# Patient Record
Sex: Female | Born: 1990 | Race: Black or African American | Hispanic: No | Marital: Single | Smoking: Never smoker
Health system: Southern US, Community
[De-identification: ages and names within clinical notes are randomized; demographics above are authoritative.]

## PROBLEM LIST (undated history)

## (undated) DIAGNOSIS — K219 Gastro-esophageal reflux disease without esophagitis: Secondary | ICD-10-CM

## (undated) DIAGNOSIS — E282 Polycystic ovarian syndrome: Secondary | ICD-10-CM

## (undated) DIAGNOSIS — R519 Headache, unspecified: Secondary | ICD-10-CM

## (undated) HISTORY — DX: Headache, unspecified: R51.9

## (undated) HISTORY — DX: Polycystic ovarian syndrome: E28.2

## (undated) HISTORY — DX: Gastro-esophageal reflux disease without esophagitis: K21.9

## (undated) HISTORY — PX: CHOLECYSTECTOMY: SHX55

---

## 2013-08-17 DIAGNOSIS — K529 Noninfective gastroenteritis and colitis, unspecified: Secondary | ICD-10-CM | POA: Insufficient documentation

## 2014-04-05 DIAGNOSIS — S93401A Sprain of unspecified ligament of right ankle, initial encounter: Secondary | ICD-10-CM | POA: Insufficient documentation

## 2019-10-09 DIAGNOSIS — K602 Anal fissure, unspecified: Secondary | ICD-10-CM | POA: Insufficient documentation

## 2019-10-09 DIAGNOSIS — K5900 Constipation, unspecified: Secondary | ICD-10-CM | POA: Insufficient documentation

## 2019-10-09 DIAGNOSIS — K625 Hemorrhage of anus and rectum: Secondary | ICD-10-CM | POA: Insufficient documentation

## 2020-10-23 ENCOUNTER — Other Ambulatory Visit: Payer: Self-pay | Admitting: Gastroenterology

## 2020-10-23 DIAGNOSIS — R1310 Dysphagia, unspecified: Secondary | ICD-10-CM

## 2020-10-29 ENCOUNTER — Ambulatory Visit
Admission: RE | Admit: 2020-10-29 | Discharge: 2020-10-29 | Disposition: A | Discharge status: Home / Self Care | Source: Ambulatory Visit | Attending: Gastroenterology | Admitting: Gastroenterology

## 2020-10-29 DIAGNOSIS — R1310 Dysphagia, unspecified: Secondary | ICD-10-CM

## 2020-12-15 DIAGNOSIS — R1030 Lower abdominal pain, unspecified: Secondary | ICD-10-CM | POA: Insufficient documentation

## 2020-12-15 DIAGNOSIS — N926 Irregular menstruation, unspecified: Secondary | ICD-10-CM | POA: Insufficient documentation

## 2021-02-13 HISTORY — PX: UTERINE FIBROID SURGERY: SHX826

## 2021-04-23 ENCOUNTER — Emergency Department (HOSPITAL_BASED_OUTPATIENT_CLINIC_OR_DEPARTMENT_OTHER)
Admission: EM | Admit: 2021-04-23 | Discharge: 2021-04-23 | Disposition: A | Discharge status: Home / Self Care | Attending: Emergency Medicine | Admitting: Emergency Medicine

## 2021-04-23 ENCOUNTER — Encounter (HOSPITAL_BASED_OUTPATIENT_CLINIC_OR_DEPARTMENT_OTHER): Payer: Self-pay | Admitting: Emergency Medicine

## 2021-04-23 ENCOUNTER — Other Ambulatory Visit: Payer: Self-pay

## 2021-04-23 DIAGNOSIS — Z91040 Latex allergy status: Secondary | ICD-10-CM | POA: Diagnosis not present

## 2021-04-23 DIAGNOSIS — R5383 Other fatigue: Secondary | ICD-10-CM | POA: Diagnosis not present

## 2021-04-23 DIAGNOSIS — M545 Low back pain, unspecified: Secondary | ICD-10-CM | POA: Diagnosis not present

## 2021-04-23 DIAGNOSIS — N939 Abnormal uterine and vaginal bleeding, unspecified: Secondary | ICD-10-CM | POA: Diagnosis present

## 2021-04-23 DIAGNOSIS — R110 Nausea: Secondary | ICD-10-CM | POA: Diagnosis not present

## 2021-04-23 DIAGNOSIS — N83202 Unspecified ovarian cyst, left side: Secondary | ICD-10-CM | POA: Insufficient documentation

## 2021-04-23 LAB — WET PREP, GENITAL
Clue Cells Wet Prep HPF POC: NONE SEEN
Sperm: NONE SEEN
Trich, Wet Prep: NONE SEEN
WBC, Wet Prep HPF POC: NONE SEEN
Yeast Wet Prep HPF POC: NONE SEEN

## 2021-04-23 LAB — CBC WITH DIFFERENTIAL/PLATELET
Abs Immature Granulocytes: 0.03 10*3/uL (ref 0.00–0.07)
Basophils Absolute: 0 10*3/uL (ref 0.0–0.1)
Basophils Relative: 1 %
Eosinophils Absolute: 0.2 10*3/uL (ref 0.0–0.5)
Eosinophils Relative: 4 %
HCT: 31.7 % — ABNORMAL LOW (ref 36.0–46.0)
Hemoglobin: 10.8 g/dL — ABNORMAL LOW (ref 12.0–15.0)
Immature Granulocytes: 1 %
Lymphocytes Relative: 37 %
Lymphs Abs: 2 10*3/uL (ref 0.7–4.0)
MCH: 28.7 pg (ref 26.0–34.0)
MCHC: 34.1 g/dL (ref 30.0–36.0)
MCV: 84.3 fL (ref 80.0–100.0)
Monocytes Absolute: 0.4 10*3/uL (ref 0.1–1.0)
Monocytes Relative: 8 %
Neutro Abs: 2.7 10*3/uL (ref 1.7–7.7)
Neutrophils Relative %: 49 %
Platelets: 237 10*3/uL (ref 150–400)
RBC: 3.76 MIL/uL — ABNORMAL LOW (ref 3.87–5.11)
RDW: 13.4 % (ref 11.5–15.5)
WBC: 5.5 10*3/uL (ref 4.0–10.5)
nRBC: 0 % (ref 0.0–0.2)

## 2021-04-23 LAB — URINALYSIS, MICROSCOPIC (REFLEX): RBC / HPF: >50 RBC/hpf (ref 0–5)

## 2021-04-23 LAB — URINALYSIS, ROUTINE W REFLEX MICROSCOPIC
Bilirubin Urine: NEGATIVE
Glucose, UA: NEGATIVE mg/dL
Hgb urine dipstick: NEGATIVE
Ketones, ur: NEGATIVE mg/dL
Leukocytes,Ua: NEGATIVE
Nitrite: NEGATIVE
Protein, ur: 30 mg/dL — AB
Specific Gravity, Urine: 1.03 (ref 1.005–1.030)
pH: 6 (ref 5.0–8.0)

## 2021-04-23 LAB — COMPREHENSIVE METABOLIC PANEL
ALT: 26 U/L (ref 0–44)
AST: 18 U/L (ref 15–41)
Albumin: 3.6 g/dL (ref 3.5–5.0)
Alkaline Phosphatase: 59 U/L (ref 38–126)
Anion gap: 5 (ref 5–15)
BUN: 10 mg/dL (ref 6–20)
CO2: 23 mmol/L (ref 22–32)
Calcium: 8.8 mg/dL — ABNORMAL LOW (ref 8.9–10.3)
Chloride: 108 mmol/L (ref 98–111)
Creatinine, Ser: 0.67 mg/dL (ref 0.44–1.00)
GFR, Estimated: >60 mL/min (ref >60)
Glucose, Bld: 129 mg/dL — ABNORMAL HIGH (ref 70–99)
Potassium: 3.7 mmol/L (ref 3.5–5.1)
Sodium: 136 mmol/L (ref 135–145)
Total Bilirubin: 0.1 mg/dL — ABNORMAL LOW (ref 0.3–1.2)
Total Protein: 6.8 g/dL (ref 6.5–8.1)

## 2021-04-23 LAB — LIPASE, BLOOD: Lipase: 30 U/L (ref 11–51)

## 2021-04-23 LAB — PREGNANCY, URINE: Preg Test, Ur: NEGATIVE

## 2021-04-23 MED ORDER — SODIUM CHLORIDE 0.9 % IV SOLN: Freq: Once | INTRAVENOUS | Status: AC

## 2021-04-23 MED ORDER — ONDANSETRON HCL 4 MG/2ML IJ SOLN
4.0000 mg | Freq: Once | INTRAMUSCULAR | Status: AC
  Administered 2021-04-23: 4 mg via INTRAVENOUS
  Filled 2021-04-23: qty 2

## 2021-04-23 MED ORDER — FERROUS SULFATE 325 (65 FE) MG PO TABS: 325.0000 mg | ORAL_TABLET | Freq: Every day | ORAL | 0 refills | Status: DC

## 2021-04-23 MED ORDER — ACETAMINOPHEN 500 MG PO TABS
1000.0000 mg | ORAL_TABLET | Freq: Once | ORAL | Status: AC
  Administered 2021-04-23: 1000 mg via ORAL
  Filled 2021-04-23: qty 2

## 2021-04-23 MED ORDER — FENTANYL CITRATE PF 50 MCG/ML IJ SOSY
50.0000 ug | PREFILLED_SYRINGE | INTRAMUSCULAR | Status: DC | PRN
  Administered 2021-04-23: 50 ug via INTRAVENOUS
  Filled 2021-04-23: qty 1

## 2021-04-23 MED ORDER — ONDANSETRON 4 MG PO TBDP: 4.0000 mg | ORAL_TABLET | Freq: Three times a day (TID) | ORAL | 0 refills | Status: AC | PRN

## 2021-04-23 MED ORDER — IBUPROFEN 800 MG PO TABS: 800.0000 mg | ORAL_TABLET | Freq: Three times a day (TID) | ORAL | 0 refills | Status: AC

## 2021-04-23 NOTE — ED Triage Notes (Signed)
Vaginal bleeding x 1 month , 1 pad /hourly. Passing clots. Negative preg test last month. Lower abd pain

## 2021-04-23 NOTE — Medical Student Note (Signed)
[facility] Provider Student Note For educational purposes for Medical, PA and NP students only and not part of the legal medical record.   CSN: [id number] Arrival date & time: 04/23/21  1027      History   Chief Complaint Chief Complaint  Patient presents with   Vaginal Bleeding    HPI Crystal Chase is a 30 y.o. female who presents today for evaluation of vaginal bleeding. She reports the vaginal bleeding started 8/31 which she believed to be her period. However, after 1.5 weeks, the bleeding continued to worsen with passage of clots. Her PCP started her on a double dose of estrogen OCP which initially helped slow the bleeding for a few days.She states she continues to have heavy bleeding and changes her pad 1x/hr. It is associated with lower abdominal pain that she describes as a constant pain with intermittent sharp stabbing pain. She also endorses lower back pain, lightheadedness, generalized fatigue, and nausea. LMP was August 2021. Menstruation typically lasts for 5 days with 1-2 days of heavy bleeding. Hx of abnormal vaginal bleeding in May 2022 and was found to have a polyp. Polyp removed July 2022. Reports family hx of fibroids, breast cancer, and ovarian cysts. Denies possibility of pregnancy. Denies SOB, chest pain, or dizziness. Has OBGYN appt next week on 9/29.   Vaginal Bleeding  History reviewed. No pertinent past medical history.  There are no problems to display for this patient.   History reviewed. No pertinent surgical history.  OB History   No obstetric history on file.      Home Medications    Prior to Admission medications   Not on File    Family History No family history on file.  Social History Social History   Substance Use Topics   Alcohol use: Yes   Drug use: Not Currently     Allergies   Fexofenadine, Latex, and Whey   Review of Systems Review of Systems  Genitourinary:  Positive for vaginal bleeding.     Physical Exam Updated Vital Signs BP 117/87 (BP Location: Right Arm)   Pulse (!) 105   Temp 98.5 F (36.9 C) (Oral)   Ht 5\' 3"  (1.6 m)   Wt 86.2 kg   LMP 03/23/2021 (Approximate)   SpO2 99%   BMI 33.66 kg/m   Physical Exam   ED Treatments / Results  Labs (all labs ordered are listed, but only abnormal results are displayed) Labs Reviewed  WET PREP, GENITAL  PREGNANCY, URINE  CBC WITH DIFFERENTIAL/PLATELET  COMPREHENSIVE METABOLIC PANEL  LIPASE, BLOOD  URINALYSIS, ROUTINE W REFLEX MICROSCOPIC  GC/CHLAMYDIA PROBE AMP ([place]) NOT AT [facility]    EKG  Radiology No results found.  Procedures Procedures (including critical care time)  Medications Ordered in ED Medications  0.9 %  sodium chloride infusion (has no administration in time range)  fentaNYL (SUBLIMAZE) injection 50 mcg (has no administration in time range)  acetaminophen (TYLENOL) tablet 1,000 mg (has no administration in time range)     Initial Impression / Assessment and Plan / ED Course  I have reviewed the triage vital signs and the nursing notes.  Pertinent labs & imaging results that were available during my care of the patient were reviewed by me and considered in my medical decision making (see chart for details).    Will obtain basic labs to evaluate for anemia given few weeks of heavy vaginal bleeding with associated fatigue and lightheadedness. Provided symptomatic pain relief while in ED and  encouraged OTC medications for pain relief until OB appt next week. Given hx of polyps, will need further work-up for possibility of polyps, fibroids, ovarian cysts, and other gynecological issues which can be completed outpatient.  Final Clinical Impressions(s) / ED Diagnoses   Final diagnoses:  None    New Prescriptions New Prescriptions   No medications on file

## 2021-04-23 NOTE — ED Provider Notes (Addendum)
[facility] EMERGENCY DEPARTMENT Provider Note   CSN: [id number] Arrival date & time: 04/23/21  2924     History Chief Complaint  Patient presents with   Vaginal Bleeding    Crystal Chase is a 30 y.o. female.  HPI Crystal Chase is a 30 y.o. female who presents today for evaluation of vaginal bleeding. She reports the vaginal bleeding started 8/31 which she believed to be her period. However, after 1.5 weeks, the bleeding continued to worsen with passage of clots. Her PCP started her on a double dose of estrogen OCP which initially helped slow the bleeding for a few days but she feels that her bleeding has continued.  She states she continues to have heavy bleeding and changes her pad 1x/hr. It is associated with lower abdominal pain that she describes as a constant pain with intermittent sharp stabbing pain. She also endorses lower back pain, lightheadedness, generalized fatigue, and nausea. LMP was August 2021. Menstruation typically lasts for 5 days with 1-2 days of heavy bleeding. Hx of abnormal vaginal bleeding in May 2022 and was found to have a polyp. Polyp removed July 2022. Reports family hx of fibroids, breast cancer, and ovarian cysts. Denies possibility of pregnancy. Denies SOB, chest pain, or dizziness. Has OBGYN appt next week on 9/29.  Has not been taking any ibuprofen although she was recommended 800 mg every 6 hours by OB/GYN.  She denies any chest pain or shortness of breath    History reviewed. No pertinent past medical history.  There are no problems to display for this patient.   History reviewed. No pertinent surgical history.   OB History   No obstetric history on file.     No family history on file.  Social History   Substance Use Topics   Alcohol use: Yes   Drug use: Not Currently    Home Medications Prior to Admission medications   Medication Sig Start Date End Date Taking? Authorizing Provider  ferrous sulfate 325 (65 FE) MG  tablet Take 1 tablet (325 mg total) by mouth daily. 04/23/21  Yes Claron Rosencrans S, PA  ibuprofen (ADVIL) 800 MG tablet Take 1 tablet (800 mg total) by mouth 3 (three) times daily. 04/23/21  Yes Damiyah Ditmars S, PA  ondansetron (ZOFRAN ODT) 4 MG disintegrating tablet Take 1 tablet (4 mg total) by mouth every 8 (eight) hours as needed for nausea or vomiting. 04/23/21  Yes Fermin Yan S, PA    Allergies    Fexofenadine, Latex, and Whey  Review of Systems   Review of Systems  Constitutional:  Negative for chills and fever.  HENT:  Negative for congestion.   Eyes:  Negative for pain.  Respiratory:  Negative for cough and shortness of breath.   Cardiovascular:  Negative for chest pain and leg swelling.  Gastrointestinal:  Positive for abdominal pain. Negative for diarrhea, nausea and vomiting.  Genitourinary:  Positive for vaginal bleeding. Negative for dysuria.  Musculoskeletal:  Negative for myalgias.  Skin:  Negative for rash.  Neurological:  Negative for dizziness and headaches.   Physical Exam Updated Vital Signs BP 107/69   Pulse 75   Temp 98.5 F (36.9 C) (Oral)   Resp 16   Ht 5\' 3"  (1.6 m)   Wt 86.2 kg   LMP 03/23/2021 (Approximate)   SpO2 97%   BMI 33.66 kg/m   Physical Exam Vitals and nursing note reviewed. Exam conducted with a chaperone present (PA student 03/25/2021).  Constitutional:  General: She is not in acute distress. HENT:     Head: Normocephalic and atraumatic.     Nose: Nose normal.     Mouth/Throat:     Mouth: Mucous membranes are moist.  Eyes:     General: No scleral icterus. Cardiovascular:     Rate and Rhythm: Normal rate and regular rhythm.     Pulses: Normal pulses.     Heart sounds: Normal heart sounds.  Pulmonary:     Effort: Pulmonary effort is normal. No respiratory distress.     Breath sounds: Normal breath sounds. No wheezing.  Abdominal:     Palpations: Abdomen is soft.     Tenderness: There is no abdominal tenderness.  There is no right CVA tenderness, left CVA tenderness, guarding or rebound.  Genitourinary:    Comments: Vulva without lesions or abnormality Vaginal canal without abnormal discharge or lesion --there is bleeding present on exam w clots. No BRB Cervix appears normal, is closed No adnexal tenderness or CMT   Musculoskeletal:     Cervical back: Normal range of motion.     Right lower leg: No edema.     Left lower leg: No edema.  Skin:    General: Skin is warm and dry.     Capillary Refill: Capillary refill takes less than 2 seconds.  Neurological:     Mental Status: She is alert. Mental status is at baseline.  Psychiatric:        Mood and Affect: Mood normal.        Behavior: Behavior normal.    ED Results / Procedures / Treatments   Labs (all labs ordered are listed, but only abnormal results are displayed) Labs Reviewed  CBC WITH DIFFERENTIAL/PLATELET - Abnormal; Notable for the following components:      Result Value   RBC 3.76 (*)    Hemoglobin 10.8 (*)    HCT 31.7 (*)    All other components within normal limits  COMPREHENSIVE METABOLIC PANEL - Abnormal; Notable for the following components:   Glucose, Bld 129 (*)    Calcium 8.8 (*)    Total Bilirubin 0.1 (*)    All other components within normal limits  URINALYSIS, ROUTINE W REFLEX MICROSCOPIC - Abnormal; Notable for the following components:   APPearance HAZY (*)    Protein, ur 30 (*)    All other components within normal limits  URINALYSIS, MICROSCOPIC (REFLEX) - Abnormal; Notable for the following components:   Bacteria, UA FEW (*)    All other components within normal limits  WET PREP, GENITAL  PREGNANCY, URINE  LIPASE, BLOOD  GC/CHLAMYDIA PROBE AMP ([place]) NOT AT [facility]    EKG None  Radiology No results found.  Procedures Procedures   Medications Ordered in ED Medications  0.9 %  sodium chloride infusion (0 mLs Intravenous Stopped 04/23/21 1308)  acetaminophen (TYLENOL) tablet 1,000 mg (1,000  mg Oral Given 04/23/21 1056)  ondansetron (ZOFRAN) injection 4 mg (4 mg Intravenous Given 04/23/21 1118)    ED Course  I have reviewed the triage vital signs and the nursing notes.  Pertinent labs & imaging results that were available during my care of the patient were reviewed by me and considered in my medical decision making (see chart for details).    MDM Rules/Calculators/A&P                           Patient is a 30 year old female with past medical history detailed  in HPI.  Patient is well-appearing on my exam has no abdominal tenderness and fall exam is benign although she does have some bleeding.  Patient with hemoglobin 2 weeks ago of 12.2 She also had an ultrasound done 2 weeks ago which showed hereogenous endometrium likely due to bleeding.   IMPRESSION:  1. 4.1 cm left ovarian cyst. No follow up imaging recommended. Note:  This recommendation does not apply to premenarchal patients or to  those with increased risk (genetic, family history, elevated tumor  markers or other high-risk factors) of ovarian cancer. Reference:  Radiology 2019 Nov; 293(2):359-371.  2. Mildly heterogeneous endometrium may be related to patient's  active bleeding. If there is high clinical concern for focal  endometrial lesion, consider follow-up sonohysterogram.  3. Small amount of free fluid in the pelvis.    Patient's hemoglobin today has dropped 1.4 g every 2 weeks.  She does feel that her bleeding has improved some today.  She is following up of her OB/GYN in 1 week.  CMP without abnormality lipase within norm limits urinalysis inconsistent with infection.  Urine pregnancy negative. Wet prep without yeast trichomonas or clue cells GC chlamydia pending  Return precautions were given.  Discussed case briefly with Dr. Renaye Rakers attending  physician.  Ultimately patient understands strict return precautions.  Will take ibuprofen regularly which she has not been taking.  Crystal Chase was  evaluated in Emergency Department on 04/23/2021 for the symptoms described in the history of present illness. She was evaluated in the context of the global COVID-19 pandemic, which necessitated consideration that the patient might be at risk for infection with the SARS-CoV-2 virus that causes COVID-19. Institutional protocols and algorithms that pertain to the evaluation of patients at risk for COVID-19 are in a state of rapid change based on information released by regulatory bodies including the CDC and federal and state organizations. These policies and algorithms were followed during the patient's care in the ED.   Final Clinical Impression(s) / ED Diagnoses Final diagnoses:  Vaginal bleeding    Rx / DC Orders ED Discharge Orders          Ordered    ferrous sulfate 325 (65 FE) MG tablet  Daily        04/23/21 1238    ibuprofen (ADVIL) 800 MG tablet  3 times daily        04/23/21 1238    ondansetron (ZOFRAN ODT) 4 MG disintegrating tablet  Every 8 hours PRN        04/23/21 1245             Gailen Shelter, [place] 04/23/21 1850    Gailen Shelter, [place] 04/23/21 1854    Terald Sleeper, MD 04/24/21 1047

## 2021-04-23 NOTE — Discharge Instructions (Addendum)
Please follow-up with your OB/GYN. Please take ibuprofen 800 mg every 6 hours.  This will help with pain and will help with bleeding.  Please drink plenty of water.  Return to the ER for any new or concerning symptoms. 0  Please keep your appointment with your OB/GYN.  You may always return to the ER for any new or concerning symptoms.

## 2021-04-24 LAB — GC/CHLAMYDIA PROBE AMP (~~LOC~~) NOT AT ARMC
Chlamydia: NEGATIVE
Comment: NEGATIVE
Comment: NORMAL
Neisseria Gonorrhea: NEGATIVE

## 2021-04-30 DIAGNOSIS — E282 Polycystic ovarian syndrome: Secondary | ICD-10-CM | POA: Insufficient documentation

## 2021-06-10 DIAGNOSIS — D500 Iron deficiency anemia secondary to blood loss (chronic): Secondary | ICD-10-CM | POA: Insufficient documentation

## 2021-10-30 ENCOUNTER — Other Ambulatory Visit: Payer: Self-pay | Admitting: Gastroenterology

## 2021-10-30 DIAGNOSIS — R1033 Periumbilical pain: Secondary | ICD-10-CM

## 2021-10-30 DIAGNOSIS — R110 Nausea: Secondary | ICD-10-CM

## 2021-11-12 ENCOUNTER — Ambulatory Visit
Admission: RE | Admit: 2021-11-12 | Discharge: 2021-11-12 | Disposition: A | Discharge status: Home / Self Care | Source: Ambulatory Visit | Attending: Gastroenterology | Admitting: Gastroenterology

## 2021-11-12 ENCOUNTER — Other Ambulatory Visit

## 2021-11-12 DIAGNOSIS — R110 Nausea: Secondary | ICD-10-CM

## 2021-11-12 DIAGNOSIS — R1033 Periumbilical pain: Secondary | ICD-10-CM

## 2021-11-12 MED ORDER — IOPAMIDOL (ISOVUE-300) INJECTION 61%
100.0000 mL | Freq: Once | INTRAVENOUS | Status: AC | PRN
  Administered 2021-11-12: 100 mL via INTRAVENOUS

## 2022-01-13 ENCOUNTER — Other Ambulatory Visit: Payer: Self-pay

## 2022-01-13 ENCOUNTER — Emergency Department (HOSPITAL_BASED_OUTPATIENT_CLINIC_OR_DEPARTMENT_OTHER)
Admission: EM | Admit: 2022-01-13 | Discharge: 2022-01-13 | Disposition: A | Discharge status: Home / Self Care | Attending: Emergency Medicine | Admitting: Emergency Medicine

## 2022-01-13 ENCOUNTER — Encounter (HOSPITAL_BASED_OUTPATIENT_CLINIC_OR_DEPARTMENT_OTHER): Payer: Self-pay

## 2022-01-13 DIAGNOSIS — Z91040 Latex allergy status: Secondary | ICD-10-CM | POA: Insufficient documentation

## 2022-01-13 DIAGNOSIS — G43809 Other migraine, not intractable, without status migrainosus: Secondary | ICD-10-CM | POA: Diagnosis not present

## 2022-01-13 DIAGNOSIS — G43909 Migraine, unspecified, not intractable, without status migrainosus: Secondary | ICD-10-CM | POA: Diagnosis present

## 2022-01-13 LAB — CBC WITH DIFFERENTIAL/PLATELET
Abs Immature Granulocytes: 0.04 10*3/uL (ref 0.00–0.07)
Basophils Absolute: 0 10*3/uL (ref 0.0–0.1)
Basophils Relative: 0 %
Eosinophils Absolute: 0.1 10*3/uL (ref 0.0–0.5)
Eosinophils Relative: 1 %
HCT: 41.6 % (ref 36.0–46.0)
Hemoglobin: 13.7 g/dL (ref 12.0–15.0)
Immature Granulocytes: 1 %
Lymphocytes Relative: 23 %
Lymphs Abs: 1.5 10*3/uL (ref 0.7–4.0)
MCH: 28.2 pg (ref 26.0–34.0)
MCHC: 32.9 g/dL (ref 30.0–36.0)
MCV: 85.6 fL (ref 80.0–100.0)
Monocytes Absolute: 0.3 10*3/uL (ref 0.1–1.0)
Monocytes Relative: 4 %
Neutro Abs: 4.7 10*3/uL (ref 1.7–7.7)
Neutrophils Relative %: 71 %
Platelets: 230 10*3/uL (ref 150–400)
RBC: 4.86 MIL/uL (ref 3.87–5.11)
RDW: 13.8 % (ref 11.5–15.5)
WBC: 6.7 10*3/uL (ref 4.0–10.5)
nRBC: 0 % (ref 0.0–0.2)

## 2022-01-13 LAB — BASIC METABOLIC PANEL
Anion gap: 12 (ref 5–15)
BUN: 9 mg/dL (ref 6–20)
CO2: 23 mmol/L (ref 22–32)
Calcium: 10 mg/dL (ref 8.9–10.3)
Chloride: 102 mmol/L (ref 98–111)
Creatinine, Ser: 0.66 mg/dL (ref 0.44–1.00)
GFR, Estimated: >60 mL/min (ref >60)
Glucose, Bld: 98 mg/dL (ref 70–99)
Potassium: 4.3 mmol/L (ref 3.5–5.1)
Sodium: 137 mmol/L (ref 135–145)

## 2022-01-13 MED ORDER — SODIUM CHLORIDE 0.9 % IV BOLUS
1000.0000 mL | Freq: Once | INTRAVENOUS | Status: AC
  Administered 2022-01-13: 1000 mL via INTRAVENOUS

## 2022-01-13 MED ORDER — DIPHENHYDRAMINE HCL 50 MG/ML IJ SOLN
12.5000 mg | Freq: Once | INTRAMUSCULAR | Status: AC
  Administered 2022-01-13: 12.5 mg via INTRAVENOUS
  Filled 2022-01-13: qty 1

## 2022-01-13 MED ORDER — METOCLOPRAMIDE HCL 5 MG/ML IJ SOLN
10.0000 mg | Freq: Once | INTRAMUSCULAR | Status: AC
  Administered 2022-01-13: 10 mg via INTRAVENOUS
  Filled 2022-01-13: qty 2

## 2022-01-13 MED ORDER — KETOROLAC TROMETHAMINE 30 MG/ML IJ SOLN
30.0000 mg | Freq: Once | INTRAMUSCULAR | Status: AC
  Administered 2022-01-13: 30 mg via INTRAVENOUS
  Filled 2022-01-13: qty 1

## 2022-01-13 NOTE — ED Provider Notes (Signed)
[facility] EMERGENCY DEPT Provider Note   CSN: [id number] Arrival date & time: 01/13/22  1051     History  Chief Complaint  Patient presents with   Migraine    Crystal Chase is a 30 y.o. female.  Patient reports that she has a history of migraine headaches.  Patient has seen her primary care physician and an ophthalmologist.  Patient reports she was treated at urgent care yesterday she is currently on Maxalt and prednisone.  Patient denies any cough URI or sinus symptoms she has not had any fever or chills.  Patient denies any numbness or tingling in extremities.  She does not have any weakness.  The history is provided by the patient. No language interpreter was used.  Migraine This is a new problem. The problem occurs constantly. The problem has not changed since onset.Pertinent negatives include no chest pain and no abdominal pain. Nothing aggravates the symptoms. Nothing relieves the symptoms. She has tried nothing for the symptoms.       Home Medications Prior to Admission medications   Medication Sig Start Date End Date Taking? Authorizing Provider  ferrous sulfate 325 (65 FE) MG tablet Take 1 tablet (325 mg total) by mouth daily. 04/23/21   Gailen Shelter, PA  ibuprofen (ADVIL) 800 MG tablet Take 1 tablet (800 mg total) by mouth 3 (three) times daily. 04/23/21   Gailen Shelter, PA  ondansetron (ZOFRAN ODT) 4 MG disintegrating tablet Take 1 tablet (4 mg total) by mouth every 8 (eight) hours as needed for nausea or vomiting. 04/23/21   Gailen Shelter, PA      Allergies    Fexofenadine, Latex, and Whey    Review of Systems   Review of Systems  Cardiovascular:  Negative for chest pain.  Gastrointestinal:  Negative for abdominal pain.  All other systems reviewed and are negative.   Physical Exam Updated Vital Signs BP 108/69   Pulse 79   Temp 98 F (36.7 C)   Resp 18   Ht 5\' 3"  (1.6 m)   Wt 92.5 kg   LMP 10/12/2021   SpO2 98%   BMI 36.14  kg/m  Physical Exam Vitals and nursing note reviewed.  Constitutional:      Appearance: She is well-developed.  HENT:     Head: Normocephalic and atraumatic.     Right Ear: Tympanic membrane normal.     Left Ear: Tympanic membrane normal.     Nose: Nose normal.     Mouth/Throat:     Mouth: Mucous membranes are moist.  Cardiovascular:     Rate and Rhythm: Normal rate.  Pulmonary:     Effort: Pulmonary effort is normal.  Abdominal:     General: Abdomen is flat. There is no distension.  Musculoskeletal:        General: Normal range of motion.     Cervical back: Normal range of motion.  Skin:    General: Skin is warm.  Neurological:     General: No focal deficit present.     Mental Status: She is alert and oriented to person, place, and time.     ED Results / Procedures / Treatments   Labs (all labs ordered are listed, but only abnormal results are displayed) Labs Reviewed  CBC WITH DIFFERENTIAL/PLATELET  BASIC METABOLIC PANEL    EKG None  Radiology No results found.  Procedures Procedures    Medications Ordered in ED Medications  sodium chloride 0.9 % bolus 1,000 mL (1,000 mLs Intravenous  New Bag/Given 01/13/22 1509)  ketorolac (TORADOL) 30 MG/ML injection 30 mg (30 mg Intravenous Given 01/13/22 1512)  metoCLOPramide (REGLAN) injection 10 mg (10 mg Intravenous Given 01/13/22 1511)  diphenhydrAMINE (BENADRYL) injection 12.5 mg (12.5 mg Intravenous Given 01/13/22 1510)    ED Course/ Medical Decision Making/ A&P                           Medical Decision Making Patient complains of a migraine headache  Amount and/or Complexity of Data Reviewed External Data Reviewed: notes.    Details: Primary care notes and urgent care notes reviewed Labs: ordered. Decision-making details documented in ED Course.    Details: Labs ordered reviewed and interpreted patient has a normal CBC she has a normal Bmet  Risk Prescription drug management. Risk Details: Patient given  IV fluids x1 L she is given Benadryl Toradol and Reglan.  Patient is reevaluated she reports feeling better after receiving the medications.  Patient is given information for neurology follow-up.  Patient is advised to return if any problems           Final Clinical Impression(s) / ED Diagnoses Final diagnoses:  Other migraine without status migrainosus, not intractable    Rx / DC Orders ED Discharge Orders     None      An After Visit Summary was printed and given to the patient.    Elson Areas, [place] 01/13/22 1640    Terald Sleeper, MD 01/13/22 [phone]

## 2022-01-13 NOTE — ED Triage Notes (Signed)
States having migraines for about month. Pain to temples radiating into top of head.  Light sensitive.  Vomited once in 24 hours  Seen by PCP and told to go to ED if continues.

## 2022-02-15 ENCOUNTER — Ambulatory Visit (INDEPENDENT_AMBULATORY_CARE_PROVIDER_SITE_OTHER): Admitting: Psychiatry

## 2022-02-15 ENCOUNTER — Encounter: Payer: Self-pay | Admitting: Psychiatry

## 2022-02-15 VITALS — BP 127/93 | HR 91 | Ht 63.0 in | Wt 205.0 lb

## 2022-02-15 DIAGNOSIS — R519 Headache, unspecified: Secondary | ICD-10-CM | POA: Diagnosis not present

## 2022-02-15 DIAGNOSIS — G43109 Migraine with aura, not intractable, without status migrainosus: Secondary | ICD-10-CM | POA: Diagnosis not present

## 2022-02-15 MED ORDER — SUMATRIPTAN SUCCINATE 50 MG PO TABS: 50.0000 mg | ORAL_TABLET | ORAL | 6 refills | Status: DC | PRN

## 2022-02-15 MED ORDER — TOPIRAMATE 25 MG PO TABS: ORAL_TABLET | ORAL | 3 refills | Status: DC

## 2022-02-15 MED ORDER — METHOCARBAMOL 500 MG PO TABS: 500.0000 mg | ORAL_TABLET | Freq: Three times a day (TID) | ORAL | 0 refills | Status: AC

## 2022-02-15 NOTE — Patient Instructions (Addendum)
Take methocarbamol (muscle relaxer) up to 3 times a day for the next 5 days to help break your current headache cycle.  Start Topamax for headache prevention. Take 25 mg (1 pill) at bedtime for one week, then increase to 50 mg (2 pills) at bedtime for one week, then take 75 mg (3 pills) at bedtime for one week, then take 100 mg (4 pills) at bedtime and continue on this dose  Start Imitrex as needed for migraine. Take at the onset of migraine. If headache recurs or does not fully resolve, you may take a second dose after 2 hours. Please avoid taking more than 2 days per week to avoid rebound headaches  Try to limit as-needed medications to 2 days per week to avoid rebound headaches   GENERAL HEADACHE INSTRUCTIONS Headache Preventive Treatment: Please keep in mind that it takes 4-6 weeks for the medication to start working well and 2-3 months at the appropriate dose before deciding if it will be useful or not. If it is not helping at all by this time, then we will discuss other medications to try. Supplements may take 3-6 months until you see full effect.   Natural supplements: Magnesium Oxide or Magnesium Glycinate 500 mg at bed (up to 800 mg daily) Coenzyme Q10 300 mg in AM Vitamin B2- 200 mg twice a day  Add 1 supplement at a time since even natural supplements can have undesirable side effects. You can sometimes buy supplements cheaper (especially Coenzyme Q10) at www.WebmailGuide.co.za or at [employer].  Vitamins and herbs that show potential:  Magnesium: Magnesium (250 mg twice a day or 500 mg at bed) has a relaxant effect on smooth muscles such as blood vessels. Individuals suffering from frequent or daily headache usually have low magnesium levels which can be increase with daily supplementation of 400-750 mg. Three trials found 40-90% average headache reduction  when used as a preventative. Magnesium also demonstrated the benefit in menstrually related migraine.  Magnesium is part of the messenger  system in the serotonin cascade and it is a good muscle relaxant.  It is also useful for constipation which can be a side effect of other medications used to treat migraine. Good sources include nuts, whole grains, and tomatoes. Side Effects: loose stool/diarrhea Riboflavin (vitamin B 2) 200 mg twice a day. This vitamin assists nerve cells in the production of ATP a principal energy storing molecule.  It is necessary for many chemical reactions in the body.  There have been at least 3 clinical trials of riboflavin using 400 mg per day all of which suggested that migraine frequency can be decreased.  All 3 trials showed significant improvement in over half of migraine sufferers.  The supplement is found in bread, cereal, milk, meat, and poultry.  Most Americans get more riboflavin than the recommended daily allowance, however riboflavin deficiency is not necessary for the supplements to help prevent headache. Side effects: energizing, green urine  Coenzyme Q10: This is present in almost all cells in the body and is critical component for the conversion of energy.  Recent studies have shown that a nutritional supplement of CoQ10 can reduce the frequency of migraine attacks by improving the energy production of cells as with riboflavin.  Doses of 150 mg twice a day have been shown to be effective.  Melatonin: Increasing evidence shows correlation between melatonin secretion and headache conditions.  Melatonin supplementation has decreased headache intensity and duration.  It is widely used as a sleep aid.  Sleep is  natures way of dealing with migraine.  A dose of 3 mg is recommended to start for headaches including cluster headache. Higher doses up to 15 mg has been reviewed for use in Cluster headache and have been used. The rationale behind using melatonin for cluster is that many theories regarding the cause of Cluster headache center around the disruption of the normal circadian rhythm in the brain.  This  helps restore the normal circadian rhythm.   HEADACHE DIET: Foods and beverages which may trigger migraine Note that only 20% of headache patients are food sensitive. You will know if you are food sensitive if you get a headache consistently 20 minutes to 2 hours after eating a certain food. Only cut out a food if it causes headaches, otherwise you might remove foods you enjoy! What matters most for diet is to eat a well balanced healthy diet full of vegetables and low fat protein, and to not miss meals.  Chocolate, other sweets ALL cheeses except cottage and cream cheese Dairy products, yogurt, sour cream, ice cream Liver Meat extracts (Bovril, Marmite, meat tenderizers) Meats or fish which have undergone aging, fermenting, pickling or smoking. These include: Hotdogs,salami,Lox,sausage, mortadellas,smoked salmon, pepperoni, Pickled herring Pods of broad bean (English beans, Chinese pea pods, [place] (fava) beans, lima and navy beans Ripe avocado, ripe banana Yeast extracts or active yeast preparations such as Brewer's or Fleishman's (commercial bakes goods are permitted) Tomato based foods, pizza (lasagna, etc.)  MSG (monosodium glutamate) is disguised as many things; look for these common aliases: Monopotassium glutamate Autolysed yeast Hydrolysed protein Sodium caseinate "flavorings" "all natural preservatives" Nutrasweet  Avoid all other foods that convincingly provoke headaches.  Resources: The Dizzy [place] Laundry Your Headache Diet, migrainestrong.com  https://zamora-andrews.com/  Caffeine and Migraine For patients that have migraine, caffeine intake more than 3 days per week can lead to dependency and increased migraine frequency. I would recommend cutting back on your caffeine intake as best you can. The recommended amount of caffeine is 200-300 mg daily, although migraine patients may experience dependency at even lower doses. While  you may notice an increase in headache temporarily, cutting back will be helpful for headaches in the long run. For more information on caffeine and migraine, visit: https://americanmigrainefoundation.org/resource-library/caffeine-and-migraine/  Headache Prevention Strategies:  1. Maintain a headache diary; learn to identify and avoid triggers.  - This can be a simple note where you log when you had a headache, associated symptoms, and medications used - There are several smartphone apps developed to help track migraines: Migraine Buddy, Migraine Monitor, Curelator N1-Headache App  Common triggers include: Emotional triggers: Emotional/Upset family or friends Emotional/Upset occupation Business reversal/success Anticipation anxiety Crisis-serious Post-crisis periodNew job/position   Physical triggers: Vacation Day Weekend Strenuous Exercise High Altitude Location New Move Menstrual Day Physical Illness Oversleep/Not enough sleep Weather changes Light: Photophobia or light sesnitivity treatment involves a balance between desensitization and reduction in overly strong input. Use dark polarized glasses outside, but not inside. Avoid bright or fluorescent light, but do not dim environment to the point that going into a normally lit room hurts. Consider FL-41 tint lenses, which reduce the most irritating wavelengths without blocking too much light.  These can be obtained at axonoptics.com or theraspecs.com Foods: see list above.  2. Limit use of acute treatments (over-the-counter medications, triptans, etc.) to no more than 2 days per week or 10 days per month to prevent medication overuse headache (rebound headache).    3. Follow a regular schedule (including weekends and holidays): Don't  skip meals. Eat a balanced diet. 8 hours of sleep nightly. Minimize stress. Exercise 30 minutes per day. Being overweight is associated with a 5 times increased risk of chronic migraine. Keep well  hydrated and drink 6-8 glasses of water per day.  4. Initiate non-pharmacologic measures at the earliest onset of your headache. Rest and quiet environment. Relax and reduce stress. Breathe2Relax is a free app that can instruct you on    some simple relaxtion and breathing techniques. Http://Dawnbuse.com is a    free website that provides teaching videos on relaxation.  Also, there are  many apps that   can be downloaded for "mindful" relaxation.  An app called YOGA NIDRA will help walk you through mindfulness. Another app called Calm can be downloaded to give you a structured mindfulness guide with daily reminders and skill development. Headspace for guided meditation Mindfulness Based Stress Reduction Online Course: www.palousemindfulness.com Cold compresses.  5. Don't wait!! Take the maximum allowable dosage of prescribed medication at the first sign of migraine.  6. Compliance:  Take prescribed medication regularly as directed and at the first sign of a migraine.  7. Communicate:  Call your physician when problems arise, especially if your headaches change, increase in frequency/severity, or become associated with neurological symptoms (weakness, numbness, slurred speech, etc.).  8. Headache/pain management therapies: Consider various complementary methods, including medication, behavioral therapy, psychological counselling, biofeedback, massage therapy, acupuncture, dry needling, and other modalities.  Such measures may reduce the need for medications. Counseling for pain management, where patients learn to function and ignore/minimize their pain, seems to work very well.  9. Recommend changing family's attention and focus away from patient's headaches. Instead, emphasize daily activities. If first question of day is 'How are your headaches/Do you have a headache today?', then patient will constantly think about headaches, thus making them worse. Goal is to re-direct attention away from  headaches, toward daily activities and other distractions.  10. Helpful Websites: www.AmericanHeadacheSociety.org PatentHood.ch www.headaches.org TightMarket.nl www.achenet.org  11. HEADACHE EXPECTATIONS: There are many types of headaches, and only a rare few in which complete relief can be expected.  In general, there is no cure for headache, especially migraine based headaches.  There is nothing available that completely prevents headaches from occurring, breaking through, or having periodic flare-ups and fluctuations.  Regardless of what you are using on a daily basis for prevention, episodic headaches should still be expected, and periods where frequency may escalate and fluctuate are unavoidable.   There is no quick fix for most headaches.  Furthermore, the longer you have had high frequency headaches (such as chronic daily headache), the longer it will likely take to expect any improvement.  In fact, some people will never improve, regardless of how many medications or other treatments we try.  Our treatment strategy is to evaluate for possible causes of your headache, although testing is usually always normal, even in cases of daily continuous headaches for years.  Most types of headache such as migraine are electrical brain disorders (similar to how epilepsy is an electrical brain disorders).  Therefore, there is no testing that will reveal this "dysfunctional electrical circuitry" such on MRI, or other testing.  We try to find a medication that may help lessen the frequency and/or severity of your headaches.  The goal is not to completely stop them from happening, although if that happens, great!  Different people respond to different medications, and some people just don't respond to anything, so it's usually a matter of trying different  options.  We cannot predict if or when exactly you will respond to a treatment that we provide.   Preventive headache medications take 4-6 weeks  to start working, and 2-3 months to see full effect, assuming you reach an effective dose.  Therefore, calling or messaging frequently because you have a headache flare prior to the 3 month mark is unlikely to change anything, and unfortunately there is nothing available that will expedite this, so please try to avoid this.  Our recommendation will generally be to give it adequate time first.  If you are unable to wait it out for medications to work, we can also try IV infusions for some temporary relief.     In general, the best that preventive medications or other treatments (including Botox) are able to offer in migraine management (variable in other headache types) is a 50% improvement in frequency and/or severity of headache.  That is our goal, and any additional benefit is considered a bonus.  Some people do significantly better than this, others do not get close to this.  Therefore, if your headaches are not improving by at least 3 months on your preventive strategy, contact us and we can discuss further adjustments.  Keep in mind that complete headache cure is not a realistic expectation.  Refills: Please pay attention to when your refills will need to be renewed. Due to the volume of phone calls daily, this could potentially take a few days, although we certainly try to honor your refill requests as soon as we can.  You should call at least 1 week in advance of needing a refill to ensure you do not run out of medication.  Keep in mind that refill requests on Fridays may not be filled until the following week.

## 2022-02-15 NOTE — Progress Notes (Signed)
Referring:  Elson Areas, PA-C [address] [place],  [place] [address]  PCP: Ladora Daniel, PA-C  Neurology was asked to evaluate Charlean Carneal, a 31 year old female for a chief complaint of headaches.  Our recommendations of care will be communicated by shared medical record.    CC:  headaches  History provided from self  HPI:  Medical co-morbidities: PCOS, GERD  The patient presents for evaluation of daily headaches which began 2 months ago. She currently has a constant headache with superimposed migraines at least 2 days out of the week. Migraines are described as sharp, throbbing pain in the left temple with associated photophobia, phonophobia, and nausea. She will have a visual aura of flashing lights before her migraine. She currently takes OTCs and Maxalt as needed which are minimally effective. She is taking some form of rescue medication every day.  She first went to an eye doctor to see if her vision might be contributing to her headaches. They noted amblyopia OS but otherwise her eye exam was normal.   She has very irregular periods. Her last period was in March 2023.  Headache History: Onset: 2 months ago Triggers: sneezing Aura: blurred vision, flashing spots Location: left frontal, temple, vertex Quality/Description: sharp, throbbing Associated Symptoms:  Photophobia: yes  Phonophobia: yes  Nausea: yes Vomiting: no Worse with activity?: yes Duration of headaches: constant  Headache days per month: 30 Headache free days per month: 0  Current Treatment: Abortive Maxalt 10 mg Aleve Excedrin  Preventative none  Prior Therapies                                 Maxalt 10 mg Aleve Excedrin Phenergan Zofran prednisone   LABS: CBC    Component Value Date/Time   WBC 6.7 01/13/2022 1515   RBC 4.86 01/13/2022 1515   HGB 13.7 01/13/2022 1515   HCT 41.6 01/13/2022 1515   PLT 230 01/13/2022 1515   MCV 85.6 01/13/2022 1515   MCH 28.2  01/13/2022 1515   MCHC 32.9 01/13/2022 1515   RDW 13.8 01/13/2022 1515   LYMPHSABS 1.5 01/13/2022 1515   MONOABS 0.3 01/13/2022 1515   EOSABS 0.1 01/13/2022 1515   BASOSABS 0.0 01/13/2022 1515      Latest Ref Rng & Units 01/13/2022    3:15 PM 04/23/2021   10:54 AM  CMP  Glucose 70 - 99 mg/dL 98  315   BUN 6 - 20 mg/dL 9  10   Creatinine 1.76 - 1.00 mg/dL 1.60  7.37   Sodium 106 - 145 mmol/L 137  136   Potassium 3.5 - 5.1 mmol/L 4.3  3.7   Chloride 98 - 111 mmol/L 102  108   CO2 22 - 32 mmol/L 23  23   Calcium 8.9 - 10.3 mg/dL 26.9  8.8   Total Protein 6.5 - 8.1 g/dL  6.8   Total Bilirubin 0.3 - 1.2 mg/dL  0.1   Alkaline Phos 38 - 126 U/L  59   AST 15 - 41 U/L  18   ALT 0 - 44 U/L  26      IMAGING:  none  Current Outpatient Medications on File Prior to Visit  Medication Sig Dispense Refill   ibuprofen (ADVIL) 800 MG tablet Take 1 tablet (800 mg total) by mouth 3 (three) times daily. 21 tablet 0   metFORMIN (GLUCOPHAGE-XR) 500 MG 24 hr tablet Take  500 mg by mouth daily.     ondansetron (ZOFRAN ODT) 4 MG disintegrating tablet Take 1 tablet (4 mg total) by mouth every 8 (eight) hours as needed for nausea or vomiting. 20 tablet 0   No current facility-administered medications on file prior to visit.     Allergies: Allergies  Allergen Reactions   Fexofenadine Shortness Of Breath and Swelling   Latex Itching   Whey Anaphylaxis    Family History: Migraine or other headaches in the family:  mother Aneurysms in a first degree relative:  no Brain tumors in the family:  no Other neurological illness in the family:   no  Past Medical History: Past Medical History:  Diagnosis Date   Acid reflux    Headache    PCOS (polycystic ovarian syndrome)     Past Surgical History Past Surgical History:  Procedure Laterality Date   CHOLECYSTECTOMY     gall stone removal   UTERINE FIBROID SURGERY  02/13/2021   hysterectoscope    Social History: Social History   Tobacco  Use   Smoking status: Never   Smokeless tobacco: Never  Substance Use Topics   Alcohol use: Yes    Comment: socially   Drug use: Not Currently     ROS: Negative for fevers, chills. Positive for headaches. All other systems reviewed and negative unless stated otherwise in HPI.   Physical Exam:   Vital Signs: BP (!) 127/93   Pulse 91   Ht 5\' 3"  (1.6 m)   Wt 205 lb (93 kg)   BMI 36.31 kg/m  GENERAL: well appearing,in no acute distress,alert SKIN:  Color, texture, turgor normal. No rashes or lesions HEAD:  Normocephalic/atraumatic. CV:  RRR RESP: Normal respiratory effort MSK: +tenderness to palpation over L>R occiput, neck, and shoulders  NEUROLOGICAL: Mental Status: Alert, oriented to person, place and time,Follows commands Cranial Nerves: PERRL, visual fields intact to confrontation, extraocular movements intact, allodynia left V1-3, no facial droop or ptosis, hearing grossly intact, no dysarthria Motor: muscle strength 5/5 both upper and lower extremities,no drift, normal tone Reflexes: 2+ throughout Sensation: intact to light touch all 4 extremities Coordination: Finger-to- nose-finger intact bilaterally Gait: normal-based   IMPRESSION: 31 year old female with a history of PCOS and GERD who presents for evaluation of worsening daily headaches. Will order MRI brain as headaches have continued to worsen despite treatment. Her headache pattern is most consistent with chronic migraine. Suspect she may have a component of medication overuse headache as well. Counseled on limiting rescue medication use to prevent rebound headaches. Will start a 5 day course of muscle relaxers to help break current headache. Will start Topamax for migraine prevention and Imitrex for rescue as Maxalt has been ineffective.  PLAN: -MRI brain -Robaxin 500 mg TID x5 days -Prevention: Start Topamax 25 mg QHS, increase by 25 mg weekly up to 100 mg QHS -Rescue: Start Imitrex 50 mg PRN -Counseled on  limiting rescue medications to 2 days per week to avoid rebound headaches   I spent a total of 25 minutes chart reviewing and counseling the patient. Headache education was done. Discussed treatment options including preventive and acute medications, and natural supplements. Discussed medication overuse headache and to limit use of acute treatments to no more than 2 days/week or 10 days/month. Discussed medication side effects, adverse reactions and drug interactions. Written educational materials and patient instructions outlining all of the above were given.  Follow-up: 6 months   38, MD 02/15/2022   3:21 PM

## 2022-02-25 ENCOUNTER — Telehealth: Payer: Self-pay | Admitting: Psychiatry

## 2022-02-25 NOTE — Telephone Encounter (Signed)
[facility] NPR sent to GI

## 2022-03-12 ENCOUNTER — Ambulatory Visit
Admission: RE | Admit: 2022-03-12 | Discharge: 2022-03-12 | Disposition: A | Discharge status: Home / Self Care | Source: Ambulatory Visit | Attending: Psychiatry | Admitting: Psychiatry

## 2022-03-12 ENCOUNTER — Other Ambulatory Visit: Payer: Self-pay | Admitting: Psychiatry

## 2022-03-12 DIAGNOSIS — R519 Headache, unspecified: Secondary | ICD-10-CM

## 2022-03-12 MED ORDER — GADOBENATE DIMEGLUMINE 529 MG/ML IV SOLN: 18.0000 mL | Freq: Once | INTRAVENOUS | Status: DC | PRN

## 2022-03-15 ENCOUNTER — Other Ambulatory Visit

## 2022-03-16 ENCOUNTER — Telehealth: Payer: Self-pay | Admitting: Psychiatry

## 2022-03-16 MED ORDER — LORAZEPAM 0.5 MG PO TABS: ORAL_TABLET | ORAL | 0 refills | Status: DC

## 2022-03-16 NOTE — Addendum Note (Signed)
Addended by: Ocie Doyne on: 03/16/2022 01:20 PM   Modules accepted: Orders

## 2022-03-16 NOTE — Telephone Encounter (Signed)
Pt is calling and requesting Dr. Delena Bali write her a prescription for some medication for her MRI on 8/18.

## 2022-03-16 NOTE — Telephone Encounter (Signed)
Will you prescribe something for pt to take during MRI ?

## 2022-03-16 NOTE — Telephone Encounter (Signed)
Rx for ativan sent to her pharmacy

## 2022-03-19 ENCOUNTER — Ambulatory Visit
Admission: RE | Admit: 2022-03-19 | Discharge: 2022-03-19 | Disposition: A | Discharge status: Home / Self Care | Source: Ambulatory Visit | Attending: Psychiatry | Admitting: Psychiatry

## 2022-03-19 DIAGNOSIS — R519 Headache, unspecified: Secondary | ICD-10-CM | POA: Diagnosis not present

## 2022-03-19 MED ORDER — GADOBENATE DIMEGLUMINE 529 MG/ML IV SOLN
15.0000 mL | Freq: Once | INTRAVENOUS | Status: AC | PRN
  Administered 2022-03-19: 15 mL via INTRAVENOUS

## 2022-03-22 ENCOUNTER — Emergency Department (HOSPITAL_BASED_OUTPATIENT_CLINIC_OR_DEPARTMENT_OTHER)
Admission: EM | Admit: 2022-03-22 | Discharge: 2022-03-22 | Disposition: A | Discharge status: Home / Self Care | Attending: Emergency Medicine | Admitting: Emergency Medicine

## 2022-03-22 ENCOUNTER — Encounter (HOSPITAL_BASED_OUTPATIENT_CLINIC_OR_DEPARTMENT_OTHER): Payer: Self-pay | Admitting: Emergency Medicine

## 2022-03-22 ENCOUNTER — Other Ambulatory Visit: Payer: Self-pay

## 2022-03-22 ENCOUNTER — Emergency Department (HOSPITAL_BASED_OUTPATIENT_CLINIC_OR_DEPARTMENT_OTHER)

## 2022-03-22 DIAGNOSIS — R1030 Lower abdominal pain, unspecified: Secondary | ICD-10-CM | POA: Insufficient documentation

## 2022-03-22 DIAGNOSIS — Z91040 Latex allergy status: Secondary | ICD-10-CM | POA: Diagnosis not present

## 2022-03-22 DIAGNOSIS — Z7984 Long term (current) use of oral hypoglycemic drugs: Secondary | ICD-10-CM | POA: Insufficient documentation

## 2022-03-22 DIAGNOSIS — N939 Abnormal uterine and vaginal bleeding, unspecified: Secondary | ICD-10-CM | POA: Insufficient documentation

## 2022-03-22 DIAGNOSIS — R5383 Other fatigue: Secondary | ICD-10-CM | POA: Diagnosis not present

## 2022-03-22 LAB — URINALYSIS, ROUTINE W REFLEX MICROSCOPIC
Bilirubin Urine: NEGATIVE
Glucose, UA: NEGATIVE mg/dL
Ketones, ur: NEGATIVE mg/dL
Leukocytes,Ua: NEGATIVE
Nitrite: NEGATIVE
Protein, ur: 30 mg/dL — AB
Specific Gravity, Urine: >=1.03 (ref 1.005–1.030)
pH: 5.5 (ref 5.0–8.0)

## 2022-03-22 LAB — CBC WITH DIFFERENTIAL/PLATELET
Abs Immature Granulocytes: 0.01 10*3/uL (ref 0.00–0.07)
Basophils Absolute: 0 10*3/uL (ref 0.0–0.1)
Basophils Relative: 0 %
Eosinophils Absolute: 0.2 10*3/uL (ref 0.0–0.5)
Eosinophils Relative: 4 %
HCT: 36.2 % (ref 36.0–46.0)
Hemoglobin: 12.5 g/dL (ref 12.0–15.0)
Immature Granulocytes: 0 %
Lymphocytes Relative: 33 %
Lymphs Abs: 1.2 10*3/uL (ref 0.7–4.0)
MCH: 29.7 pg (ref 26.0–34.0)
MCHC: 34.5 g/dL (ref 30.0–36.0)
MCV: 86 fL (ref 80.0–100.0)
Monocytes Absolute: 0.5 10*3/uL (ref 0.1–1.0)
Monocytes Relative: 14 %
Neutro Abs: 1.8 10*3/uL (ref 1.7–7.7)
Neutrophils Relative %: 49 %
Platelets: 178 10*3/uL (ref 150–400)
RBC: 4.21 MIL/uL (ref 3.87–5.11)
RDW: 13.3 % (ref 11.5–15.5)
WBC: 3.7 10*3/uL — ABNORMAL LOW (ref 4.0–10.5)
nRBC: 0 % (ref 0.0–0.2)

## 2022-03-22 LAB — COMPREHENSIVE METABOLIC PANEL
ALT: 48 U/L — ABNORMAL HIGH (ref 0–44)
AST: 24 U/L (ref 15–41)
Albumin: 3.6 g/dL (ref 3.5–5.0)
Alkaline Phosphatase: 54 U/L (ref 38–126)
Anion gap: 8 (ref 5–15)
BUN: 10 mg/dL (ref 6–20)
CO2: 23 mmol/L (ref 22–32)
Calcium: 8.6 mg/dL — ABNORMAL LOW (ref 8.9–10.3)
Chloride: 105 mmol/L (ref 98–111)
Creatinine, Ser: 0.68 mg/dL (ref 0.44–1.00)
GFR, Estimated: >60 mL/min (ref >60)
Glucose, Bld: 96 mg/dL (ref 70–99)
Potassium: 3.5 mmol/L (ref 3.5–5.1)
Sodium: 136 mmol/L (ref 135–145)
Total Bilirubin: 0.6 mg/dL (ref 0.3–1.2)
Total Protein: 6.8 g/dL (ref 6.5–8.1)

## 2022-03-22 LAB — URINALYSIS, MICROSCOPIC (REFLEX): RBC / HPF: >50 RBC/hpf (ref 0–5)

## 2022-03-22 LAB — PREGNANCY, URINE: Preg Test, Ur: NEGATIVE

## 2022-03-22 LAB — LIPASE, BLOOD: Lipase: 27 U/L (ref 11–51)

## 2022-03-22 MED ORDER — KETOROLAC TROMETHAMINE 30 MG/ML IJ SOLN
30.0000 mg | Freq: Once | INTRAMUSCULAR | Status: AC
  Administered 2022-03-22: 30 mg via INTRAVENOUS
  Filled 2022-03-22: qty 1

## 2022-03-22 MED ORDER — KETOROLAC TROMETHAMINE 30 MG/ML IJ SOLN: 30.0000 mg | Freq: Once | INTRAMUSCULAR | Status: DC

## 2022-03-22 NOTE — Discharge Instructions (Signed)
Your labs revealed a stable hemoglobin, your ultrasound showed no acute abnormalities.  I recommend that you schedule follow-up appointment with Dr. Roxan Hockey who is your OB/GYN.  Please return if you start developing worsening symptoms or no improvement after several days.

## 2022-03-22 NOTE — ED Triage Notes (Addendum)
Patient presents to ED via POV from home. Here with vaginal bleeding that began yesterday. Patient reports going through 36 pads since bleeding began. Reports passing blood clots. Last period was in March. Reports history of similar event that required surgery.

## 2022-03-22 NOTE — ED Provider Notes (Signed)
[facility] EMERGENCY DEPARTMENT Provider Note   CSN: [id number] Arrival date & time: 03/22/22  0867     History PMH: PCOS, Cholecystectomy, Abnormal uterine bleeding s/p d&C in July 2022 with Dr. Roxan Hockey through [facility] Chief Complaint  Patient presents with   Vaginal Bleeding    Crystal Chase is a 31 y.o. female. Presents with abnormal uterine bleeding.  She states that she started having vaginal bleeding yesterday.  She has had 32 pads that she is went through.  She does feel little bit fatigued since then.  She has associated pelvic cramping.  She states this happened in the past several years ago where she bled this heavily.  She said she had to go through a D&C at that time.  Never required admission. Her last menstrual cycle was in March 2023.  She has not had any menstrual cycle since then.  Did have some spotting last month.  She has had numerous negative pregnancy test at home.    Vaginal Bleeding Associated symptoms: fatigue   Associated symptoms: no abdominal pain, no dizziness, no dysuria, no fever, no nausea and no vaginal discharge        Home Medications Prior to Admission medications   Medication Sig Start Date End Date Taking? Authorizing Provider  ibuprofen (ADVIL) 800 MG tablet Take 1 tablet (800 mg total) by mouth 3 (three) times daily. 04/23/21   Gailen Shelter, PA  LORazepam (ATIVAN) 0.5 MG tablet Take 1-2 tablets 30 minutes prior to MRI 03/16/22   Ocie Doyne, MD  metFORMIN (GLUCOPHAGE-XR) 500 MG 24 hr tablet Take 500 mg by mouth daily. 01/30/22   [provider]  ondansetron (ZOFRAN ODT) 4 MG disintegrating tablet Take 1 tablet (4 mg total) by mouth every 8 (eight) hours as needed for nausea or vomiting. 04/23/21   Gailen Shelter, PA  SUMAtriptan (IMITREX) 50 MG tablet Take 1 tablet (50 mg total) by mouth every 2 (two) hours as needed for migraine. May repeat in 2 hours if headache persists or recurs. 02/15/22   Ocie Doyne, MD  topiramate (TOPAMAX) 25 MG tablet Take 25 mg (1 pill) at bedtime for one week, then increase to 50 mg (2 pills) at bedtime for one week, then take 75 mg (3 pills) at bedtime for one week, then take 100 mg (4 pills) at bedtime and continue on this dose 02/15/22   Ocie Doyne, MD      Allergies    Fexofenadine, Latex, and Whey    Review of Systems   Review of Systems  Constitutional:  Positive for fatigue. Negative for fever.  Respiratory:  Negative for shortness of breath.   Cardiovascular:  Negative for chest pain.  Gastrointestinal:  Negative for abdominal distention, abdominal pain, constipation, diarrhea, nausea and vomiting.  Genitourinary:  Positive for pelvic pain and vaginal bleeding. Negative for dysuria, flank pain, hematuria and vaginal discharge.  Neurological:  Negative for dizziness.  All other systems reviewed and are negative.   Physical Exam Updated Vital Signs BP 110/74   Pulse 75   Temp (!) 97.5 F (36.4 C)   Resp 17   LMP 10/02/2021 (Exact Date)   SpO2 99%  Physical Exam Vitals and nursing note reviewed.  Constitutional:      General: She is not in acute distress.    Appearance: Normal appearance. She is well-developed. She is not ill-appearing, toxic-appearing or diaphoretic.  HENT:     Head: Normocephalic and atraumatic.     Nose: No  nasal deformity.     Mouth/Throat:     Lips: Pink. No lesions.  Eyes:     General: Gaze aligned appropriately. No scleral icterus.       Right eye: No discharge.        Left eye: No discharge.     Conjunctiva/sclera: Conjunctivae normal.     Right eye: Right conjunctiva is not injected. No exudate or hemorrhage.    Left eye: Left conjunctiva is not injected. No exudate or hemorrhage. Pulmonary:     Effort: Pulmonary effort is normal. No respiratory distress.  Abdominal:     General: Abdomen is flat.     Palpations: Abdomen is soft.     Tenderness: There is abdominal tenderness. There is no right CVA  tenderness, left CVA tenderness, guarding or rebound.     Comments: Suprapubic tenderness, mild  Skin:    General: Skin is warm and dry.  Neurological:     Mental Status: She is alert and oriented to person, place, and time.  Psychiatric:        Mood and Affect: Mood normal.        Speech: Speech normal.        Behavior: Behavior normal. Behavior is cooperative.     ED Results / Procedures / Treatments   Labs (all labs ordered are listed, but only abnormal results are displayed) Labs Reviewed  CBC WITH DIFFERENTIAL/PLATELET - Abnormal; Notable for the following components:      Result Value   WBC 3.7 (*)    All other components within normal limits  COMPREHENSIVE METABOLIC PANEL - Abnormal; Notable for the following components:   Calcium 8.6 (*)    ALT 48 (*)    All other components within normal limits  URINALYSIS, ROUTINE W REFLEX MICROSCOPIC - Abnormal; Notable for the following components:   APPearance CLOUDY (*)    Hgb urine dipstick LARGE (*)    Protein, ur 30 (*)    All other components within normal limits  URINALYSIS, MICROSCOPIC (REFLEX) - Abnormal; Notable for the following components:   Bacteria, UA MANY (*)    All other components within normal limits  LIPASE, BLOOD  PREGNANCY, URINE    EKG None  Radiology US PELVIC COMPLETE W TRANSVAGINAL AND TORSION R/O  Result Date: 03/22/2022 CLINICAL DATA:  Abnormal uterine bleeding. EXAM: TRANSABDOMINAL AND TRANSVAGINAL ULTRASOUND OF PELVIS DOPPLER ULTRASOUND OF OVARIES TECHNIQUE: Both transabdominal and transvaginal ultrasound examinations of the pelvis were performed. Transabdominal technique was performed for global imaging of the pelvis including uterus, ovaries, adnexal regions, and pelvic cul-de-sac. It was necessary to proceed with endovaginal exam following the transabdominal exam to visualize the uterus, endometrium, ovaries, and adnexa. Color and duplex Doppler ultrasound was utilized to evaluate blood flow to  the ovaries. COMPARISON:  CT abdomen pelvis dated November 12, 2021. Pelvic ultrasound dated April 08, 2021. FINDINGS: Uterus Measurements: 7.9 x 3.9 x 4.7 cm = volume: 76 mL. No fibroids or other mass visualized. Endometrium Thickness: 3 mm.  No focal abnormality visualized. Right ovary Measurements: 2.9 x 3.0 x 1.7 cm = volume: 7.8 mL. Normal appearance/no adnexal mass. Left ovary Measurements: 2.9 x 2.0 x 3.2 cm = volume: 9.4 mL. Normal appearance/no adnexal mass. Pulsed Doppler evaluation of both ovaries demonstrates normal low-resistance arterial and venous waveforms. Other findings Trace free fluid in the pelvis, likely physiologic. IMPRESSION: 1. Normal pelvic ultrasound. If bleeding remains unresponsive to hormonal or medical therapy, sonohysterogram should be considered for focal lesion work-up. (Ref: Radiological  Reasoning: Algorithmic Workup of Abnormal Vaginal Bleeding with Endovaginal Sonography and Sonohysterography. AJR 2008; 376:E83-15) Electronically Signed   By: Obie Dredge M.D.   On: 03/22/2022 12:57    Procedures Procedures   Medications Ordered in ED Medications  ketorolac (TORADOL) 30 MG/ML injection 30 mg (30 mg Intravenous Given 03/22/22 1131)    ED Course/ Medical Decision Making/ A&P Clinical Course as of 03/22/22 1310  Mon Mar 22, 2022  1126 Pelvic exam with  moderate blood.  [GL]    Clinical Course User Index [GL] Victorino Dike Finis Bud, PA-C                           Medical Decision Making Amount and/or Complexity of Data Reviewed Labs: ordered. Radiology: ordered.  Risk Prescription drug management.    MDM  This is a 31 y.o. female who presents to the ED with Abnormal Uterine Bleeding The differential of this patient includes but is not limited to Threatened Abortion, Inevitable Abortion, Missed Abortion, Ectopic Pregnancy, Complete Abortion, PALM-COIEN  Initial Impression Well appearing with stable vitals  I personally ordered, reviewed, and  interpreted all laboratory work and imaging and agree with radiologist interpretation. Results interpreted below:  Hemoglobin is 12.5 and up from her baseline, CMP reassuring, lipase normal, pregnancy negative, UA with bacteria and no white blood cells.  Pelvic ultrasound has no acute abnormalities to explain symptoms  Assessment/Plan:  Here with vaginal bleeding for 2 days now.  She is in no acute distress and has stable vitals.  Upon pelvic exam there is only a moderate amount of blood in the vaginal vault.  No oozing or gushing of blood present.  Her hemoglobin is actually up from her baseline and completely normal.  Having no other systemic symptoms.  She had a pelvic ultrasound without any evidence of torsion, or other acute abnormalities.  This is not ectopic or miscarriage as she has a negative pregnancy test.  I discussed results with patient.  Do not recommend any medication therapy as this could be indicative of a normal menstrual cycle and she has no symptomatic or laboratory findings suggest that she needs any sort of intervention this time.  I recommend that she follow-up with her OB/GYN, Dr. Roxan Hockey for with [facility] for further intervention.  She is given return precautions if she starts develop symptoms or does not have any improvement.   Charting Requirements Additional history is obtained from:  Independent historian External Records from outside source obtained and reviewed including: OB/GYN records, prior hemoglobin Social Determinants of Health:  none Pertinant PMH that complicates patient's illness: History of abnormal uterine bleeding, PCOS  Patient Care Problems that were addressed during this visit: -Vaginal bleeding: Acute illness with complication This patient was maintained on a cardiac monitor/telemetry. I personally viewed and interpreted the cardiac monitor which reveals an underlying rhythm of sinus rhythm Medications given in ED: Toradol Reevaluation of the  patient after these medicines showed that the patient improved I have reviewed home medications and made changes accordingly.  Critical Care Interventions: None Consultations: None Disposition: discharge  This is a supervised visit with my attending physician, Dr. Wallace Cullens. We have discussed this patient and they have altered the plan as needed.  Portions of this note were generated with Scientist, clinical (histocompatibility and immunogenetics). Dictation errors may occur despite best attempts at proofreading.    Final Clinical Impression(s) / ED Diagnoses Final diagnoses:  Vaginal bleeding    Rx / DC Orders ED  Discharge Orders     None         Claudie Leach, PA-C 03/22/22 1310    Tanda Rockers A, DO 03/24/22 [phone]

## 2022-03-22 NOTE — ED Notes (Signed)
Discharge instructions reviewed with patient. Patient verbalizes understanding, no further questions at this time. Medications and follow up information provided. No acute distress noted at time of departure.  

## 2022-03-29 ENCOUNTER — Ambulatory Visit
Admission: EM | Admit: 2022-03-29 | Discharge: 2022-03-29 | Disposition: A | Discharge status: Home / Self Care | Attending: Urgent Care | Admitting: Urgent Care

## 2022-03-29 ENCOUNTER — Ambulatory Visit (INDEPENDENT_AMBULATORY_CARE_PROVIDER_SITE_OTHER)

## 2022-03-29 DIAGNOSIS — R059 Cough, unspecified: Secondary | ICD-10-CM

## 2022-03-29 DIAGNOSIS — R053 Chronic cough: Secondary | ICD-10-CM

## 2022-03-29 DIAGNOSIS — Z03822 Encounter for observation for suspected aspirated (inhaled) foreign body ruled out: Secondary | ICD-10-CM

## 2022-03-29 NOTE — ED Triage Notes (Signed)
Patient states that on Friday her cap on tooth came off during the night and patient states she choked on it and swallowed it. Throat was sore but now ok, no discomfort in her chest.  Dental office told patient she needed to come in and make sure it not in her lungs.

## 2022-03-29 NOTE — ED Notes (Signed)
Patient states that on Friday her cap on tooth came off during the night and patient states she choked on it and swallowed it. Throat was sore but now ok, no discomfort in her chest.  Dental office told patient she needed to come in and make sure it not in her lungs.  

## 2022-03-29 NOTE — Discharge Instructions (Addendum)
Your chest x-ray was clear. No signs of a foreign body on your x-ray. Follow up with your dentist.

## 2022-03-29 NOTE — ED Provider Notes (Signed)
[facility] - [facility]   MRN: [id number] DOB: 1991/02/16  Subjective:   Crystal Chase is a 31 y.o. female presenting for an evaluation for suspected inhaled foreign body.  Patient is coming at the request of her dentist to get a chest x-ray.  This is following having the cap on her tooth break off during the night and feels like she choked on it.  She does not feel like she inhaled it as much she did feel like she swallowed it.  She has a chronic cough but does not feel like it is changed in nature.  No shortness of breath, chest pain, fever.  No current facility-administered medications for this encounter.  Current Outpatient Medications:    ondansetron (ZOFRAN ODT) 4 MG disintegrating tablet, Take 1 tablet (4 mg total) by mouth every 8 (eight) hours as needed for nausea or vomiting., Disp: 20 tablet, Rfl: 0   ibuprofen (ADVIL) 800 MG tablet, Take 1 tablet (800 mg total) by mouth 3 (three) times daily., Disp: 21 tablet, Rfl: 0   LORazepam (ATIVAN) 0.5 MG tablet, Take 1-2 tablets 30 minutes prior to MRI, Disp: 2 tablet, Rfl: 0   metFORMIN (GLUCOPHAGE-XR) 500 MG 24 hr tablet, Take 500 mg by mouth daily., Disp: , Rfl:    SUMAtriptan (IMITREX) 50 MG tablet, Take 1 tablet (50 mg total) by mouth every 2 (two) hours as needed for migraine. May repeat in 2 hours if headache persists or recurs., Disp: 10 tablet, Rfl: 6   topiramate (TOPAMAX) 25 MG tablet, Take 25 mg (1 pill) at bedtime for one week, then increase to 50 mg (2 pills) at bedtime for one week, then take 75 mg (3 pills) at bedtime for one week, then take 100 mg (4 pills) at bedtime and continue on this dose, Disp: 120 tablet, Rfl: 3   Allergies  Allergen Reactions   Fexofenadine Shortness Of Breath and Swelling   Latex Itching   Whey Anaphylaxis    Past Medical History:  Diagnosis Date   Acid reflux    Headache    PCOS (polycystic ovarian syndrome)      Past Surgical History:  Procedure Laterality Date    CHOLECYSTECTOMY     gall stone removal   UTERINE FIBROID SURGERY  02/13/2021   hysterectoscope    Family History  Problem Relation Age of Onset   Diabetes Mother    Ulcers Mother    Gallstones Mother    Other Mother        vertigo   Migraines Mother     Social History   Tobacco Use   Smoking status: Never   Smokeless tobacco: Never  Substance Use Topics   Alcohol use: Yes    Comment: socially   Drug use: Not Currently    ROS   Objective:   Vitals: BP 104/76 (BP Location: Left Arm)   Pulse 90   Temp 98.5 F (36.9 C) (Oral)   Resp 18   LMP 10/02/2021 (Exact Date)   SpO2 97%   Physical Exam Constitutional:      General: She is not in acute distress.    Appearance: Normal appearance. She is well-developed. She is not ill-appearing, toxic-appearing or diaphoretic.  HENT:     Head: Normocephalic and atraumatic.     Nose: Nose normal.     Mouth/Throat:     Mouth: Mucous membranes are moist.  Eyes:     General: No scleral icterus.       Right eye:  No discharge.        Left eye: No discharge.     Extraocular Movements: Extraocular movements intact.  Cardiovascular:     Rate and Rhythm: Normal rate.  Pulmonary:     Effort: Pulmonary effort is normal.  Skin:    General: Skin is warm and dry.  Neurological:     General: No focal deficit present.     Mental Status: She is alert and oriented to person, place, and time.  Psychiatric:        Mood and Affect: Mood normal.        Behavior: Behavior normal.     DG Chest 2 View  Result Date: 03/29/2022 CLINICAL DATA:  Cough, suspect foreign body. EXAM: CHEST - 2 VIEW COMPARISON:  None Available. FINDINGS: The heart size and mediastinal contours are within normal limits. Both lungs are clear. The visualized skeletal structures are unremarkable. IMPRESSION: No active cardiopulmonary disease. Electronically Signed   By: Darliss Cheney M.D.   On: 03/29/2022 19:21     Assessment and Plan :   PDMP not reviewed this  encounter.  1. Suspected inhaled foreign body not found after observation   2. Chronic cough    Chest x-ray negative, recommended follow-up with her dentist.  I advised that she may end up passing it through the GI tract.  No further concerns at this point.   Wallis Bamberg, [place] 03/29/22 1953

## 2022-09-14 ENCOUNTER — Ambulatory Visit: Admitting: Psychiatry

## 2022-09-30 ENCOUNTER — Ambulatory Visit: Admitting: Psychiatry

## 2022-09-30 ENCOUNTER — Encounter: Payer: Self-pay | Admitting: Psychiatry

## 2022-09-30 NOTE — Progress Notes (Deleted)
   CC:  headaches  Follow-up Visit  Last visit: 02/16/23  Brief HPI: 32 year old female with a history of PCOS, GERD who follows in clinic for migraines.  At her last visit, brain MRI was ordered. She was started on Topamax for headache prevention and Imitrex for rescue. Interval History: *** Brain MRI 03/19/22 showed mild chronic sinusitis and was otherwise unremarkable  Headache days per month: *** Migraine days per month*** Headache free days per month: ***  Current Headache Regimen: Preventative: *** Abortive: ***   Prior Therapies                                  Rescue: Maxalt 10 mg Imitrex Aleve Excedrin Phenergan Zofran Prednisone  Prevention: Topamax  Physical Exam:   Vital Signs: There were no vitals taken for this visit. GENERAL:  well appearing, in no acute distress, alert  SKIN:  Color, texture, turgor normal. No rashes or lesions HEAD:  Normocephalic/atraumatic. RESP: normal respiratory effort MSK:  No gross joint deformities.   NEUROLOGICAL: Mental Status: Alert, oriented to person, place and time, Follows commands, and Speech fluent and appropriate. Cranial Nerves: PERRL, face symmetric, no dysarthria, hearing grossly intact Motor: moves all extremities equally Gait: normal-based.  IMPRESSION: ***  PLAN: ***   Follow-up: ***  I spent a total of *** minutes on the date of the service. Headache education was done. Discussed lifestyle modification including increased oral hydration, decreased caffeine, exercise and stress management. Discussed treatment options including preventive and acute medications, natural supplements, and infusion therapy. Discussed medication overuse headache and to limit use of acute treatments to no more than 2 days/week or 10 days/month. Discussed medication side effects, adverse reactions and drug interactions. Written educational materials and patient instructions outlining all of the above were given.  Genia Harold, MD

## 2022-12-14 ENCOUNTER — Encounter (HOSPITAL_BASED_OUTPATIENT_CLINIC_OR_DEPARTMENT_OTHER): Payer: Self-pay

## 2022-12-14 ENCOUNTER — Emergency Department (HOSPITAL_BASED_OUTPATIENT_CLINIC_OR_DEPARTMENT_OTHER)

## 2022-12-14 ENCOUNTER — Emergency Department (HOSPITAL_BASED_OUTPATIENT_CLINIC_OR_DEPARTMENT_OTHER)
Admission: EM | Admit: 2022-12-14 | Discharge: 2022-12-14 | Disposition: A | Discharge status: Home / Self Care | Attending: Emergency Medicine | Admitting: Emergency Medicine

## 2022-12-14 ENCOUNTER — Other Ambulatory Visit: Payer: Self-pay

## 2022-12-14 DIAGNOSIS — O4691 Antepartum hemorrhage, unspecified, first trimester: Secondary | ICD-10-CM | POA: Insufficient documentation

## 2022-12-14 DIAGNOSIS — Z3A01 Less than 8 weeks gestation of pregnancy: Secondary | ICD-10-CM | POA: Insufficient documentation

## 2022-12-14 DIAGNOSIS — O4690 Antepartum hemorrhage, unspecified, unspecified trimester: Secondary | ICD-10-CM

## 2022-12-14 LAB — HCG, QUANTITATIVE, PREGNANCY: hCG, Beta Chain, Quant, S: 44 m[IU]/mL — ABNORMAL HIGH (ref <5)

## 2022-12-14 NOTE — ED Triage Notes (Signed)
Pt going through fertility testing to try and get pregnant; found out yesterday through blood work that she is pregnant. Now she is having cramping pain in pelvis. She is bleeding, but mostly clots. Pt reports using 1 pad every 1-1.5 hours.

## 2022-12-14 NOTE — ED Notes (Signed)
Reviewed discharge instructions and follow up with pt. Pt states understanding 

## 2022-12-14 NOTE — ED Provider Notes (Signed)
Patient signed out to me by previous provider. Please refer to their note for full HPI.  Briefly this is a 32 year old female who is in the process of fertility to get pregnant.  Was seen at her fertility facility yesterday, had a positive pregnancy test and hCG was elevated around 27.  Started experiencing lower pelvic cramping and bleeding with clots.  Came in for evaluation.  Vital signs are stable on arrival.  Lower abdominal pain is cramping, related to bleeding.  No independent unilateral sharp pain suspicious for ectopic.  hCG is slightly increased to 44, however still not encouraging for a viable pregnancy.  Ultrasound shows no intrauterine gestation.  Spoke with the patient in depth in regards to these results.  Explained how given the numbers in the timeframe I doubt viable pregnancy, and this is possibly a miscarriage.  However she has a follow-up appointment with OB/GYN tomorrow and I believe repeat hCG testing at that time will be more confirmatory.  Discussed with the patient taking Tylenol for pain control.  Also discussed monitoring vaginal bleeding and monitoring symptoms of anemia.  Patient at this time appears safe and stable for discharge and close outpatient follow up. Discharge plan and strict return to ED precautions discussed, patient verbalizes understanding and agreement.   Rozelle Logan, [place] 12/14/22 [phone]

## 2022-12-14 NOTE — ED Notes (Signed)
US at bedside

## 2022-12-14 NOTE — ED Provider Notes (Signed)
[facility]ERGENCY DEPT Provider Note: Crystal Dell, MD, FACEP  CSN: [id number] MRN: [id number] ARRIVAL: 12/14/22 at 0331 ROOM: MH02/MH02   CHIEF COMPLAINT  Vaginal Bleeding   HISTORY OF PRESENT ILLNESS  12/14/22 3:51 AM Crystal Chase is a 32 y.o. female who is currently under going fertility testing and has been attempting to get pregnant.  She found out yesterday to blood work (quantitative beta-hCG 27) that she is pregnant.  She has had vaginal bleeding for the past 2 days which is worsening.  She is now passing clots as well and using 1 pad about every 1-1/2 hours.  She also has been having pelvic cramping for the past several hours.  She rates this pain as an 8 out of 10.    Past Medical History:  Diagnosis Date   Acid reflux    Headache    PCOS (polycystic ovarian syndrome)     Past Surgical History:  Procedure Laterality Date   CHOLECYSTECTOMY     gall stone removal   UTERINE FIBROID SURGERY  02/13/2021   hysterectoscope    Family History  Problem Relation Age of Onset   Diabetes Mother    Ulcers Mother    Gallstones Mother    Other Mother        vertigo   Migraines Mother     Social History   Tobacco Use   Smoking status: Never   Smokeless tobacco: Never  Vaping Use   Vaping Use: Never used  Substance Use Topics   Alcohol use: Yes    Comment: socially   Drug use: Not Currently    Prior to Admission medications   Medication Sig Start Date End Date Taking? Authorizing Provider  ibuprofen (ADVIL) 800 MG tablet Take 1 tablet (800 mg total) by mouth 3 (three) times daily. 04/23/21   Gailen Shelter, PA  LORazepam (ATIVAN) 0.5 MG tablet Take 1-2 tablets 30 minutes prior to MRI 03/16/22   Ocie Doyne, MD  metFORMIN (GLUCOPHAGE-XR) 500 MG 24 hr tablet Take 500 mg by mouth daily. 01/30/22   [provider]  ondansetron (ZOFRAN ODT) 4 MG disintegrating tablet Take 1 tablet (4 mg total) by mouth every 8 (eight) hours as needed for nausea or  vomiting. 04/23/21   Gailen Shelter, PA  SUMAtriptan (IMITREX) 50 MG tablet Take 1 tablet (50 mg total) by mouth every 2 (two) hours as needed for migraine. May repeat in 2 hours if headache persists or recurs. 02/15/22   Ocie Doyne, MD  topiramate (TOPAMAX) 25 MG tablet Take 25 mg (1 pill) at bedtime for one week, then increase to 50 mg (2 pills) at bedtime for one week, then take 75 mg (3 pills) at bedtime for one week, then take 100 mg (4 pills) at bedtime and continue on this dose 02/15/22   Ocie Doyne, MD    Allergies Fexofenadine, Latex, and Whey   REVIEW OF SYSTEMS  Negative except as noted here or in the History of Present Illness.   PHYSICAL EXAMINATION  Initial Vital Signs Blood pressure 125/76, pulse 98, temperature 98.1 F (36.7 C), temperature source Oral, resp. rate 20, height 5\' 3"  (1.6 m), weight 93 kg, SpO2 100 %.  Examination General: Well-developed, well-nourished female in no acute distress; appearance consistent with age of record HENT: normocephalic; atraumatic Eyes: Normal appearance Neck: supple Heart: regular rate and rhythm Lungs: clear to auscultation bilaterally Abdomen: soft; nondistended; suprapubic tenderness; bowel sounds present Extremities: No deformity; full range of motion Neurologic: Awake, alert  and oriented; motor function intact in all extremities and symmetric; no facial droop Skin: Warm and dry Psychiatric: Normal mood and affect   RESULTS  Summary of this visit's results, reviewed and interpreted by myself:   EKG Interpretation  Date/Time:    Ventricular Rate:    PR Interval:    QRS Duration:   QT Interval:    QTC Calculation:   R Axis:     Text Interpretation:         Laboratory Studies: Results for orders placed or performed during the hospital encounter of 12/14/22 (from the past 24 hour(s))  hCG, quantitative, pregnancy     Status: Abnormal   Collection Time: 12/14/22  4:03 AM  Result Value Ref Range   hCG,  Beta Chain, Quant, S 44 (H) <5 mIU/mL   Imaging Studies: US OB LESS THAN 14 WEEKS WITH OB TRANSVAGINAL  Result Date: 12/14/2022 CLINICAL DATA:  32 year old female with vaginal bleeding, quantitative beta HCG 44. Estimated gestational age by LMP 3 weeks and 5 days. EXAM: OBSTETRIC <14 WK [place] AND TRANSVAGINAL OB US TECHNIQUE: Both transabdominal and transvaginal ultrasound examinations were performed for complete evaluation of the gestation as well as the maternal uterus, adnexal regions, and pelvic cul-de-sac. Transvaginal technique was performed to assess early pregnancy. COMPARISON:  Pelvis ultrasound 03/22/2022. FINDINGS: Intrauterine gestational sac: None. Maternal uterus/adnexae: Parke Simmers appearing endometrium (series 1, image 22). Incidental nabothian cysts (series 2). No pelvis free fluid. Right ovary appears normal measuring 4.4 x 2.4 x 2.5 cm (14 mL). Left ovary only identified on transvaginal imaging but appears normal measuring 2.1 x 2.3 x 1.7 cm (4 mL). IMPRESSION: 1. No IUP, adnexal mass, or pelvis free fluid identified. 2. Differential considerations include early IUP, failed IUP, and occult ectopic pregnancy. Recommend serial quantitative beta HCG and repeat Ultrasound as necessary. Electronically Signed   By: Odessa Fleming M.D.   On: 12/14/2022 07:59    ED COURSE and MDM  Nursing notes, initial and subsequent vitals signs, including pulse oximetry, reviewed and interpreted by myself.  Vitals:   12/14/22 0400 12/14/22 0430 12/14/22 0815 12/14/22 0857  BP: (!) 125/93 114/82 116/87   Pulse: 93 94 94   Resp:   18 18  Temp:    98.1 F (36.7 C)  TempSrc:      SpO2: 100% 97% 93%   Weight:      Height:       Medications - No data to display  7:00 AM Signed out to Dr. Wilkie Aye. [place] pending. Suspect miscarriage given heavy bleeding and low b-HCG.  PROCEDURES  Procedures   ED DIAGNOSES     ICD-10-CM   1. Vaginal bleeding in pregnancy  O46.90          Shaunn Tackitt, MD 12/14/22 2242

## 2022-12-14 NOTE — Discharge Instructions (Signed)
You have been seen and discharged from the emergency department.  Your hCG hormone did slightly increase.  Ultrasound shows no intrauterine gestation.this may be because it is early pregnancy or possible miscarriage.  No evidence of ectopic pregnancy today.  It is very important that you follow-up with your OB/GYN appointment tomorrow for repeat hormone testing and follow-up.  If you experience profuse bleeding, symptoms of anemia which includes shortness of breath, chest pain, dizziness please return to an emergency department.  Take tylenol for pain control.

## 2023-02-21 NOTE — Progress Notes (Unsigned)
New Patient Note  RE: Crystal Chase MRN: [id number] DOB: Dec 17, 1990 Date of Office Visit: 02/22/2023  Consult requested by: Ladora Daniel, PA-C Primary care provider: Ladora Daniel, PA-C  Chief Complaint: Rash (Left leg- no change in routine or foods - due to ectopic pregnancy ) and Allergic Rhinitis  (Constant congestion, itchy watery eyes - take [facility] allergy pills pen )  History of Present Illness: I had the pleasure of seeing Crystal Chase for [facility]. She is a 32 y.o. female, who is referred here by Ladora Daniel, PA-C for the evaluation of rash.  Rash started around May 2024. Patient had positive pregnancy test on mother's day but couldn't find a sac.  Then found to have an ectopic tubal pregnancy.  Patient was undergoing fertility treatments as well.   Rash mainly occurs on her left leg. Describes them as itchy, bumpy, red at times.  This resolved and then came back. Now it is slowly improving. Associated symptoms include: none.  Suspected triggers are unknown. Denies any fevers, chills, changes in medications, foods, personal care products or recent infections. She has tried the following therapies: otc antihistamines, steroid shot, topical steroid cream with minimal benefit. Systemic steroids: yes.  Previous work up includes: none. Previous history of rash/hives: not like this but she does have eczema. Only uses lotion for this.  This was not patient's first pregnancy as she had multiple miscarriages in the past but never had a rash.  Patient was on fertility injections (ovidrel) and was injected them in her abdominal area with no issues.   Reviewed images on the phone - papular rash, flesh colored.   Assessment and Plan: Crystal Chase is a 32 y.o. female with: Rash and other nonspecific skin eruption Pruritic rash on left upper thigh. Patient concerned if her ectopic pregnancy contributed to this. Tried steroids,  topical creams with some benefit. Denies any other changes in diet, meds, personal care products. H/o eczema. Unclear etiology. If you break out again - take pictures and let us know. Images on the phone not consistent with an "allergic" rash. Referral to dermatology placed - for possibly skin biopsy when rash flares.  Other atopic dermatitis See below for proper skin care.  Use triamcinolone 0.1% ointment twice a day as needed for rash flares. Do not use on the face, neck, armpits or groin area. Do not use more than 3 weeks in a row.   Chronic rhinitis Perennial rhinitis symptoms and skin testing in the past was positive to grass and dust per patient report.  No prior AIT. Today's skin prick testing showed: Negative to indoor/outdoor allergens.  Start environmental control measures as below - for grass and dust.  Use Flonase (fluticasone) nasal spray 1-2 sprays per nostril once a day as needed for nasal congestion.  Nasal saline spray (i.e., Simply Saline) or nasal saline lavage (i.e., NeilMed) is recommended as needed and prior to medicated nasal sprays. Use over the counter antihistamines such as Zyrtec (cetirizine), Claritin (loratadine), or Xyzal (levocetirizine) daily as needed. May take twice a day during allergy flares.   Return if symptoms worsen or fail to improve.  Meds ordered this encounter  Medications   triamcinolone ointment (KENALOG) 0.1 %    Sig: Apply 1 Application topically 2 (two) times daily as needed (rash flare). Do not use on the face, neck, armpits or groin area. Do not use more than 3 weeks in a row.    Dispense:  30 g    Refill:  1   fluticasone (FLONASE) 50 MCG/ACT nasal spray    Sig: Place 1-2 sprays into both nostrils daily as needed (nasal congestion).    Dispense:  16 g    Refill:  5   Lab Orders  No laboratory test(s) ordered today    Other allergy screening: Asthma: no Rhino conjunctivitis: yes Perennial rhinitis symptoms.  Skin testing in the  past showed multiple positives - grass, dust per patient report. No prior AIT.  Food allergy: no Medication allergy:  allegra - facial swelling and burning ? Hymenoptera allergy: no History of recurrent infections suggestive of immunodeficency: no  Diagnostics: Skin Testing: Environmental allergy panel. Negative to indoor/outdoor allergens.  Results discussed with patient/family.  Airborne Adult Perc - 02/22/23 1457     Time Antigen Placed 0230    Allergen Manufacturer Waynette Buttery    Location Back    Number of Test 55    Panel 1 Select    1. Control-Buffer 50% Glycerol Negative    2. Control-Histamine 3+    3. Bahia Negative    4. French Southern Territories Negative    5. Johnson Negative    6. Kentucky Blue Negative    7. Meadow Fescue Negative    8. Perennial Rye Negative    9. Timothy Negative    10. Ragweed Mix Negative    11. Cocklebur Negative    12. Plantain,  English Negative    13. Baccharis Negative    14. Dog Fennel Negative    15. Russian Thistle Negative    16. Lamb's Quarters Negative    17. Sheep Sorrell Negative    18. Rough Pigweed Negative    19. Marsh Elder, Rough Negative    20. Mugwort, Common Negative    21. Box, Elder Negative    22. Cedar, red Negative    23. Sweet Gum Negative    24. Pecan Pollen Negative    25. Pine Mix Negative    26. Walnut, Black Pollen Negative    27. Red Mulberry Negative    28. Ash Mix Negative    29. Birch Mix Negative    30. Beech American Negative    31. Cottonwood, Guinea-Bissau Negative    32. Hickory, White Negative    33. Maple Mix Negative    34. Oak, [place] Mix Negative    35. Sycamore Eastern Negative    36. Alternaria Alternata Negative    37. Cladosporium Herbarum Negative    38. Aspergillus Mix Negative    39. Penicillium Mix Negative    40. Bipolaris Sorokiniana (Helminthosporium) Negative    41. Drechslera Spicifera (Curvularia) Negative    42. Mucor Plumbeus Negative    43. Fusarium Moniliforme Negative    44.  Aureobasidium Pullulans (pullulara) Negative    45. Rhizopus Oryzae Negative    46. Botrytis Cinera Negative    47. Epicoccum Nigrum Negative    48. Phoma Betae Negative    49. Dust Mite Mix Negative    50. Cat Hair 10,000 BAU/ml Negative    51.  Dog Epithelia Negative    52. Mixed Feathers Negative    53. Horse Epithelia Negative    54. Cockroach, German Negative    55. Tobacco Leaf Negative             Past Medical History: Patient Active Problem List   Diagnosis Date Noted   Rash and other nonspecific skin eruption 02/23/2023   Chronic rhinitis 02/23/2023   Other atopic  dermatitis 02/23/2023   Past Medical History:  Diagnosis Date   Acid reflux    Headache    PCOS (polycystic ovarian syndrome)    Past Surgical History: Past Surgical History:  Procedure Laterality Date   CHOLECYSTECTOMY     gall stone removal   UTERINE FIBROID SURGERY  02/13/2021   hysterectoscope   Medication List:  Current Outpatient Medications  Medication Sig Dispense Refill   fluticasone (FLONASE) 50 MCG/ACT nasal spray Place 1-2 sprays into both nostrils daily as needed (nasal congestion). 16 g 5   ibuprofen (ADVIL) 800 MG tablet Take 1 tablet (800 mg total) by mouth 3 (three) times daily. 21 tablet 0   LORazepam (ATIVAN) 0.5 MG tablet Take 1-2 tablets 30 minutes prior to MRI 2 tablet 0   metFORMIN (GLUCOPHAGE-XR) 500 MG 24 hr tablet Take 500 mg by mouth daily.     ondansetron (ZOFRAN ODT) 4 MG disintegrating tablet Take 1 tablet (4 mg total) by mouth every 8 (eight) hours as needed for nausea or vomiting. 20 tablet 0   SUMAtriptan (IMITREX) 50 MG tablet Take 1 tablet (50 mg total) by mouth every 2 (two) hours as needed for migraine. May repeat in 2 hours if headache persists or recurs. 10 tablet 6   topiramate (TOPAMAX) 25 MG tablet Take 25 mg (1 pill) at bedtime for one week, then increase to 50 mg (2 pills) at bedtime for one week, then take 75 mg (3 pills) at bedtime for one week, then  take 100 mg (4 pills) at bedtime and continue on this dose 120 tablet 3   triamcinolone ointment (KENALOG) 0.1 % Apply 1 Application topically 2 (two) times daily as needed (rash flare). Do not use on the face, neck, armpits or groin area. Do not use more than 3 weeks in a row. 30 g 1   No current facility-administered medications for this visit.   Allergies: Allergies  Allergen Reactions   Fexofenadine Shortness Of Breath and Swelling   Latex Itching   Whey Anaphylaxis   Social History: Social History   Socioeconomic History   Marital status: Single    Spouse name: Not on file   Number of children: 0   Years of education: Not on file   Highest education level: Some college, no degree  Occupational History   Not on file  Tobacco Use   Smoking status: Never   Smokeless tobacco: Never  Vaping Use   Vaping status: Never Used  Substance and Sexual Activity   Alcohol use: Yes    Comment: socially   Drug use: Not Currently   Sexual activity: Not on file  Other Topics Concern   Not on file  Social History Narrative   Caffeine- 3 x a week   Social Determinants of Health   Financial Resource Strain: Low Risk  (01/04/2023)   Received from [facility]   Overall Financial Resource Strain (CARDIA)    Difficulty of Paying Living Expenses: Not hard at all  Food Insecurity: No Food Insecurity (01/04/2023)   Received from [facility]   Hunger Vital Sign    Worried About Running Out of Food in the Last Year: Never true    Ran Out of Food in the Last Year: Never true  Transportation Needs: No Transportation Needs (01/04/2023)   Received from [facility] - Transportation    Lack of Transportation (Medical): No    Lack of Transportation (Non-Medical): No  Physical Activity: Unknown (12/07/2021)   Received  from [facility]   Exercise Vital Sign    Days of Exercise per Week: 6 days    Minutes of Exercise per Session: Not on file  Stress: Stress Concern  Present (12/07/2021)   Received from [facility], [facility] - Occupational Stress Questionnaire    Feeling of Stress : Very much  Social Connections: Unknown (12/13/2022)   Received from [facility], [facility]   Social Network    Social Network: Not on file   Lives in a 32 year old apartment. Smoking: denies Occupation: Dance movement psychotherapist.  Environmental History: Water Damage/mildew in the house: no Carpet in the family room: no Carpet in the bedroom: yes Heating: electric Cooling: central Pet: no  Family History: Family History  Problem Relation Age of Onset   Eczema Mother    Allergic rhinitis Mother    Diabetes Mother    Ulcers Mother    Gallstones Mother    Other Mother        vertigo   Migraines Mother    Allergic rhinitis Father    Allergic rhinitis Sister    Eczema Sister    Allergic rhinitis Brother    Eczema Brother    Review of Systems  Constitutional:  Negative for appetite change, chills, fever and unexpected weight change.  HENT:  Positive for congestion. Negative for rhinorrhea.   Eyes:  Negative for itching.  Respiratory:  Negative for cough, chest tightness, shortness of breath and wheezing.   Cardiovascular:  Negative for chest pain.  Gastrointestinal:  Negative for abdominal pain.  Genitourinary:  Negative for difficulty urinating.  Skin:  Positive for rash.  Neurological:  Negative for headaches.    Objective: BP 132/86   Pulse (!) 107   Temp 98.3 F (36.8 C)   Resp 18   Ht 5\' 2"  (1.575 m)   Wt 201 lb 12.8 oz (91.5 kg)   LMP 10/02/2021 (Exact Date)   SpO2 96%   BMI 36.91 kg/m  Body mass index is 36.91 kg/m. Physical Exam Vitals and nursing note reviewed.  Constitutional:      Appearance: Normal appearance. She is well-developed.  HENT:     Head: Normocephalic and atraumatic.     Right Ear: Tympanic membrane and external ear normal.     Left Ear: Tympanic membrane and external  ear normal.     Nose: Nose normal.     Mouth/Throat:     Mouth: Mucous membranes are moist.     Pharynx: Oropharynx is clear.  Eyes:     Conjunctiva/sclera: Conjunctivae normal.  Cardiovascular:     Rate and Rhythm: Normal rate and regular rhythm.     Heart sounds: Normal heart sounds. No murmur heard.    No friction rub. No gallop.  Pulmonary:     Effort: Pulmonary effort is normal.     Breath sounds: Normal breath sounds. No wheezing, rhonchi or rales.  Musculoskeletal:     Cervical back: Neck supple.  Skin:    General: Skin is warm.     Findings: Rash present.     Comments: Dry patches on left lateral upper thigh.   Neurological:     Mental Status: She is alert and oriented to person, place, and time.  Psychiatric:        Behavior: Behavior normal.   The plan was reviewed with the patient/family, and all questions/concerned were addressed.  It was my pleasure to see Keslie today and participate  in her care. Please feel free to contact me with any questions or concerns.  Sincerely,  Wyline Mood, DO Allergy & Immunology  [facility] office: [phone] [facility] office: [phone]

## 2023-02-22 ENCOUNTER — Ambulatory Visit (INDEPENDENT_AMBULATORY_CARE_PROVIDER_SITE_OTHER): Admitting: Allergy

## 2023-02-22 ENCOUNTER — Other Ambulatory Visit: Payer: Self-pay

## 2023-02-22 ENCOUNTER — Encounter: Payer: Self-pay | Admitting: Allergy

## 2023-02-22 VITALS — BP 132/86 | HR 107 | Temp 98.3°F | Resp 18 | Ht 62.0 in | Wt 201.8 lb

## 2023-02-22 DIAGNOSIS — J310 Chronic rhinitis: Secondary | ICD-10-CM

## 2023-02-22 DIAGNOSIS — R21 Rash and other nonspecific skin eruption: Secondary | ICD-10-CM | POA: Diagnosis not present

## 2023-02-22 DIAGNOSIS — L2089 Other atopic dermatitis: Secondary | ICD-10-CM | POA: Diagnosis not present

## 2023-02-22 MED ORDER — FLUTICASONE PROPIONATE 50 MCG/ACT NA SUSP: 1.0000 | Freq: Every day | NASAL | 5 refills | Status: AC | PRN

## 2023-02-22 MED ORDER — TRIAMCINOLONE ACETONIDE 0.1 % EX OINT: 1.0000 | TOPICAL_OINTMENT | Freq: Two times a day (BID) | CUTANEOUS | 1 refills | Status: AC | PRN

## 2023-02-22 NOTE — Patient Instructions (Addendum)
Today's skin testing showed: Negative to indoor/outdoor allergens.   Results given.  Skin Unclear etiology. If you break out again - take pictures and let us know. Referral to dermatology placed - for possibly skin biopsy when rash flares. For the eczema - see below for proper skin care.  Use triamcinolone 0.1% ointment twice a day as needed for rash flares. Do not use on the face, neck, armpits or groin area. Do not use more than 3 weeks in a row.   Rhinitis  Start environmental control measures as below. Use Flonase (fluticasone) nasal spray 1-2 sprays per nostril once a day as needed for nasal congestion.  Nasal saline spray (i.e., Simply Saline) or nasal saline lavage (i.e., NeilMed) is recommended as needed and prior to medicated nasal sprays. Use over the counter antihistamines such as Zyrtec (cetirizine), Claritin (loratadine), or Xyzal (levocetirizine) daily as needed. May take twice a day during allergy flares.   Follow up if needed.  Skin care recommendations  Bath time: Always use lukewarm water. AVOID very hot or cold water. Keep bathing time to 5-10 minutes. Do NOT use bubble bath. Use a mild soap and use just enough to wash the dirty areas. Do NOT scrub skin vigorously.  After bathing, pat dry your skin with a towel. Do NOT rub or scrub the skin.  Moisturizers and prescriptions:  ALWAYS apply moisturizers immediately after bathing (within 3 minutes). This helps to lock-in moisture. Use the moisturizer several times a day over the whole body. Good summer moisturizers include: Aveeno, CeraVe, Cetaphil. Good winter moisturizers include: Aquaphor, Vaseline, Cerave, Cetaphil, Eucerin, Vanicream. When using moisturizers along with medications, the moisturizer should be applied about one hour after applying the medication to prevent diluting effect of the medication or moisturize around where you applied the medications. When not using medications, the moisturizer can be  continued twice daily as maintenance.  Laundry and clothing: Avoid laundry products with added color or perfumes. Use unscented hypo-allergenic laundry products such as Tide free, Cheer free & gentle, and All free and clear.  If the skin still seems dry or sensitive, you can try double-rinsing the clothes. Avoid tight or scratchy clothing such as wool. Do not use fabric softeners or dyer sheets.  Reducing Pollen Exposure Pollen seasons: trees (spring), grass (summer) and ragweed/weeds (fall). Keep windows closed in your home and car to lower pollen exposure.  Install air conditioning in the bedroom and throughout the house if possible.  Avoid going out in dry windy days - especially early morning. Pollen counts are highest between 5 - 10 AM and on dry, hot and windy days.  Save outside activities for late afternoon or after a heavy rain, when pollen levels are lower.  Avoid mowing of grass if you have grass pollen allergy. Be aware that pollen can also be transported indoors on people and pets.  Dry your clothes in an automatic dryer rather than hanging them outside where they might collect pollen.  Rinse hair and eyes before bedtime.

## 2023-02-23 ENCOUNTER — Encounter: Payer: Self-pay | Admitting: Allergy

## 2023-02-23 DIAGNOSIS — J310 Chronic rhinitis: Secondary | ICD-10-CM | POA: Insufficient documentation

## 2023-02-23 DIAGNOSIS — L2089 Other atopic dermatitis: Secondary | ICD-10-CM | POA: Insufficient documentation

## 2023-02-23 DIAGNOSIS — R21 Rash and other nonspecific skin eruption: Secondary | ICD-10-CM | POA: Insufficient documentation

## 2023-02-23 NOTE — Assessment & Plan Note (Signed)
Pruritic rash on left upper thigh. Patient concerned if her ectopic pregnancy contributed to this. Tried steroids, topical creams with some benefit. Denies any other changes in diet, meds, personal care products. H/o eczema. Unclear etiology. If you break out again - take pictures and let us know. Images on the phone not consistent with an "allergic" rash. Referral to dermatology placed - for possibly skin biopsy when rash flares.

## 2023-02-23 NOTE — Assessment & Plan Note (Signed)
Perennial rhinitis symptoms and skin testing in the past was positive to grass and dust per patient report.  No prior AIT. Today's skin prick testing showed: Negative to indoor/outdoor allergens.  Start environmental control measures as below - for grass and dust.  Use Flonase (fluticasone) nasal spray 1-2 sprays per nostril once a day as needed for nasal congestion.  Nasal saline spray (i.e., Simply Saline) or nasal saline lavage (i.e., NeilMed) is recommended as needed and prior to medicated nasal sprays. Use over the counter antihistamines such as Zyrtec (cetirizine), Claritin (loratadine), or Xyzal (levocetirizine) daily as needed. May take twice a day during allergy flares.

## 2023-02-23 NOTE — Assessment & Plan Note (Signed)
See below for proper skin care. Use triamcinolone 0.1% ointment twice a day as needed for rash flares. Do not use on the face, neck, armpits or groin area. Do not use more than 3 weeks in a row.  

## 2023-03-04 ENCOUNTER — Telehealth: Payer: Self-pay

## 2023-03-04 NOTE — Telephone Encounter (Signed)
Referral has been placed to [facility].

## 2023-03-04 NOTE — Telephone Encounter (Signed)
-----   Message from [facility] Lassalle [facility]un[facility] M sent at 02/23/2023  9:39 AM EDT -----  ----- Message ----- From: Ellamae Sia, DO Sent: 02/23/2023   6:52 AM EDT To: [facility]  Please place referral to dermatology for rash. Thank you.

## 2023-05-30 IMAGING — CT CT ABD-PELV W/ CM
1 of 2 series · 15 of 32 positions shown, 19 images · IV contrast (agent unspecified)
Comparison: None.

CLINICAL DATA: Periumbilical pain

EXAM:
CT ABDOMEN AND PELVIS WITH CONTRAST
TECHNIQUE: Multidetector CT imaging of the abdomen and pelvis was performed
using the standard protocol following bolus administration of
intravenous contrast.

[Series 2: a/p w/ 5mm · axial · 0.98mm/px · z∈[-427,+3]mm · 15 of 94 slices shown, 19 images]
[im 4/94  soft-tissue]
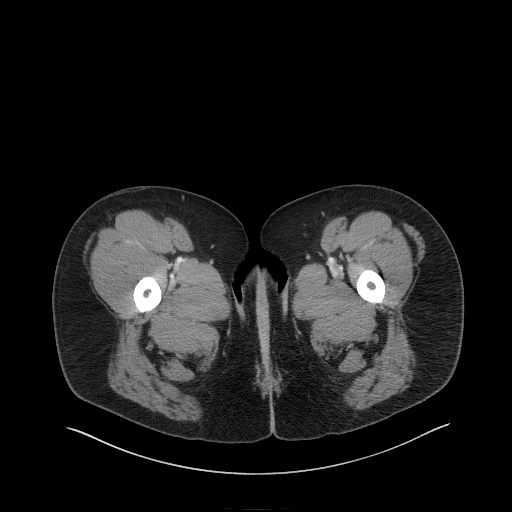
[im 4/94  bone]
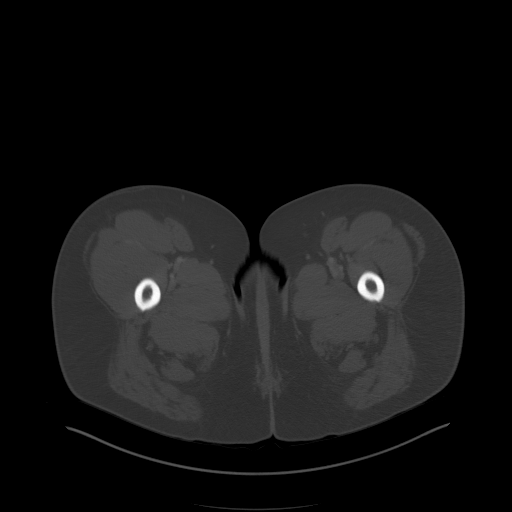
[im 12/94  soft-tissue]
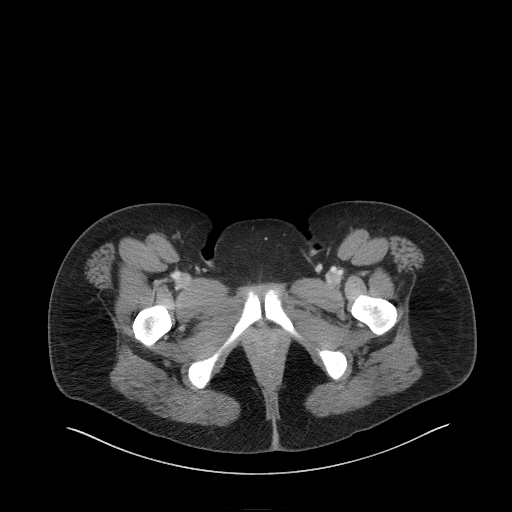
[im 20/94  soft-tissue]
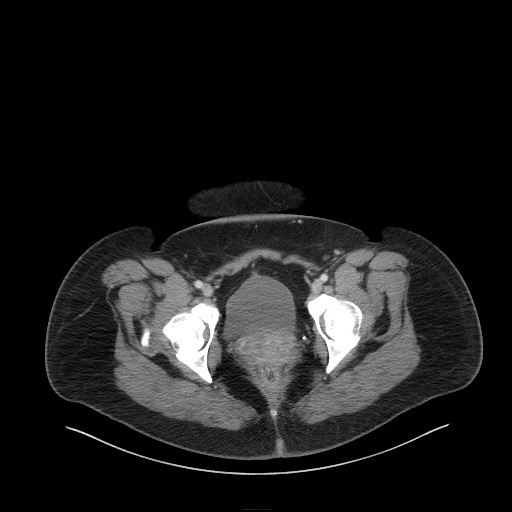
[im 28/94  soft-tissue]
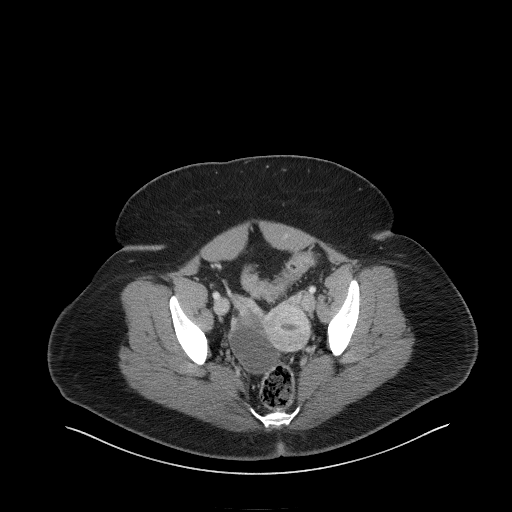
[im 32/94  soft-tissue]
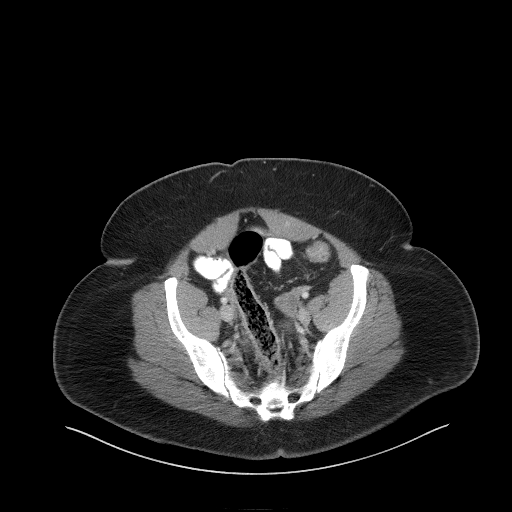
[im 39/94  soft-tissue]
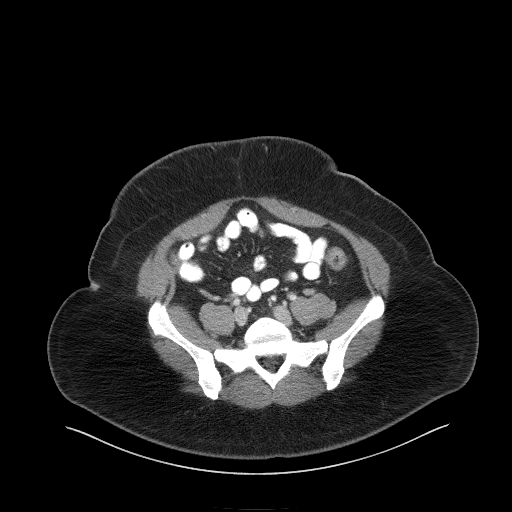
[im 47/94  soft-tissue]
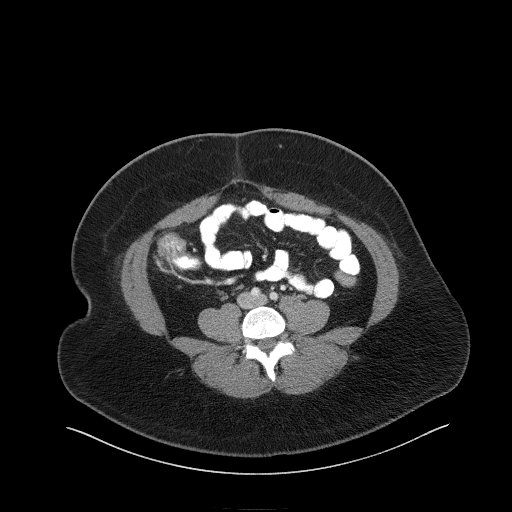
[im 55/94  soft-tissue]
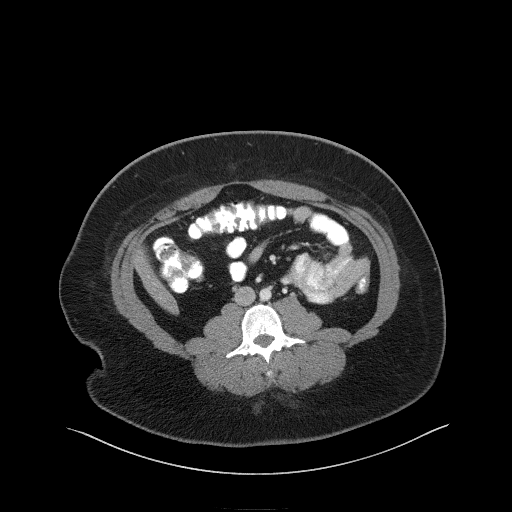
[im 63/94  soft-tissue]
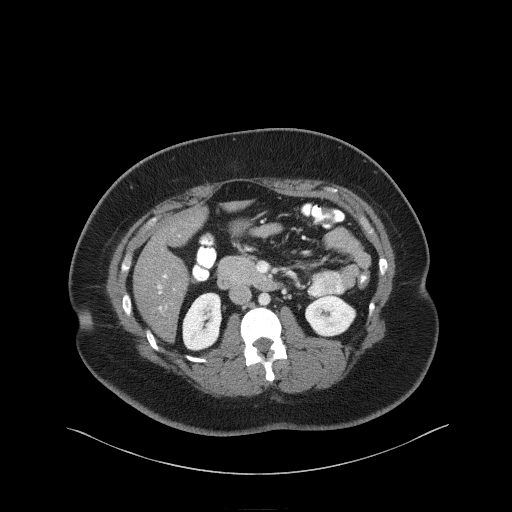
[im 63/94  bone]
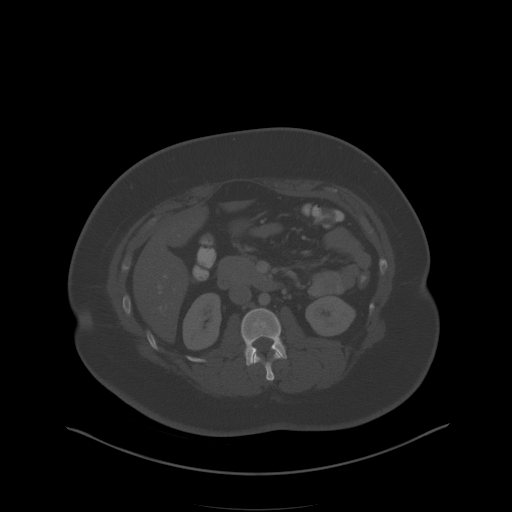
[im 66/94  soft-tissue]
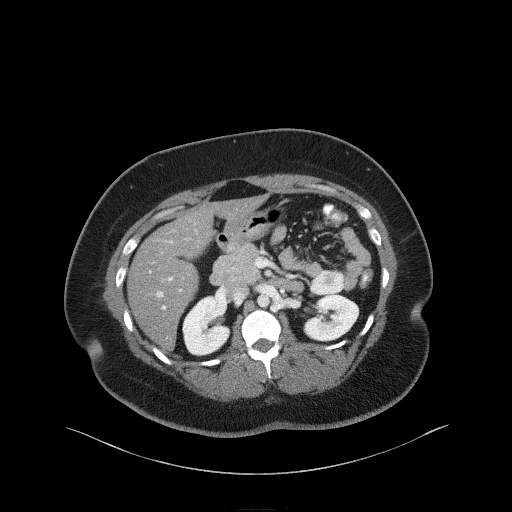
[im 74/94  soft-tissue]
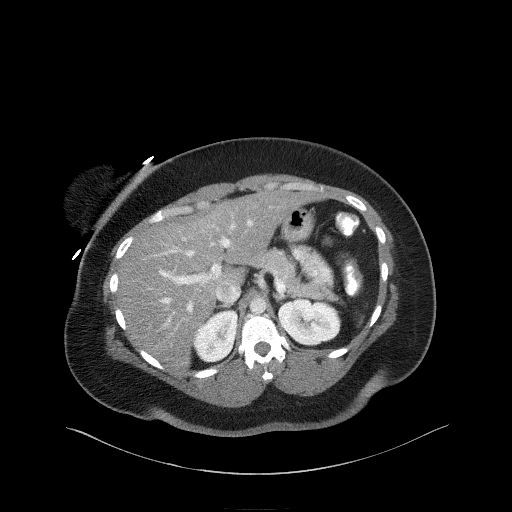
[im 78/94  lung]
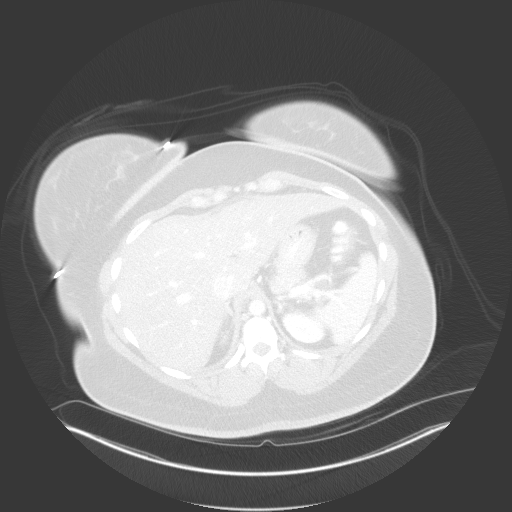
[im 82/94  soft-tissue]
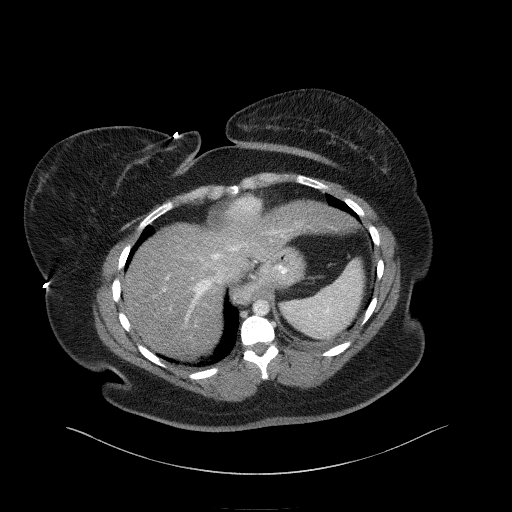
[im 82/94  lung]
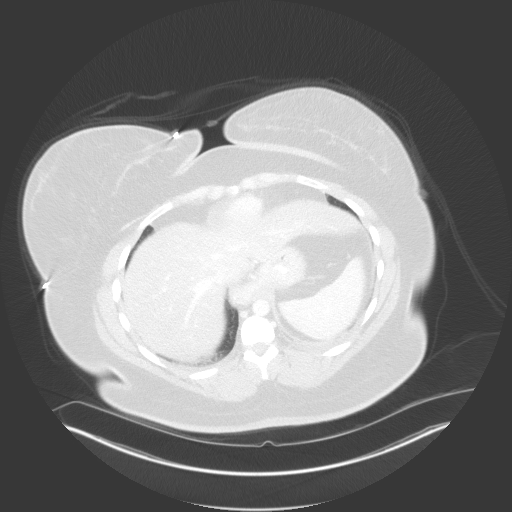
[im 86/94  lung]
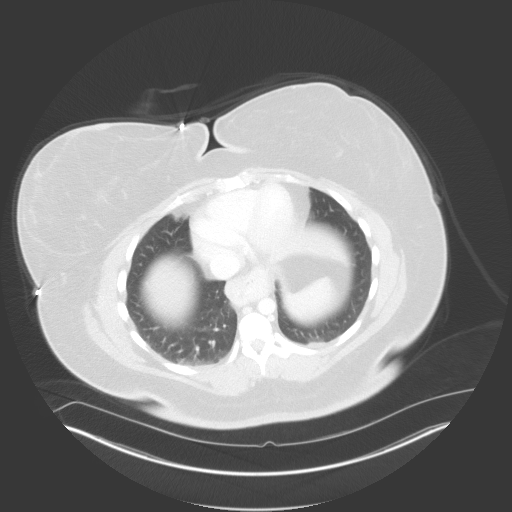
[im 90/94  soft-tissue]
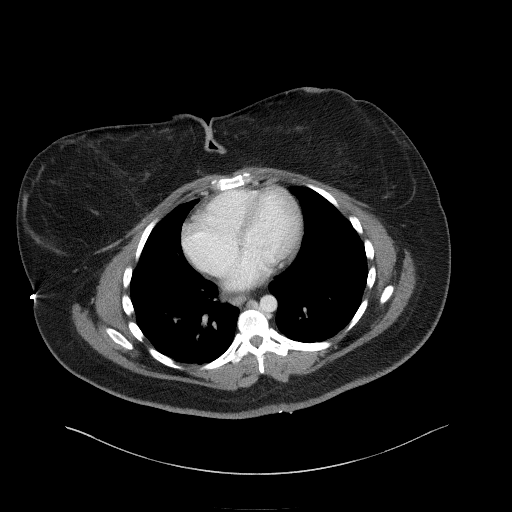
[im 90/94  lung]
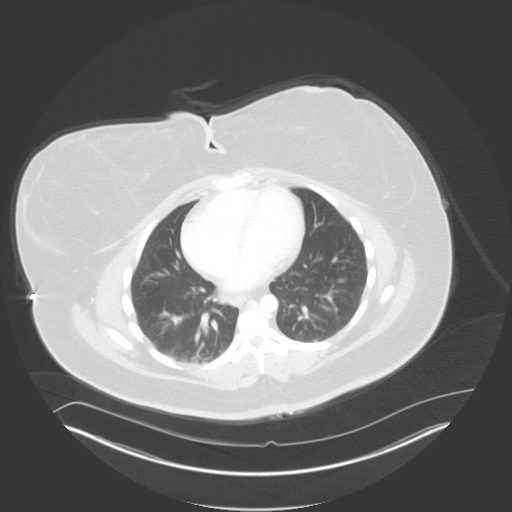

[15 of 32 positions shown; findings below may reference images not displayed]

RADIATION DOSE REDUCTION: This exam was performed according to the
departmental dose-optimization program which includes automated
exposure control, adjustment of the mA and/or kV according to
patient size and/or use of iterative reconstruction technique.

CONTRAST:  100mL VMZ0Z7-CFF IOPAMIDOL (VMZ0Z7-CFF) INJECTION 61%
FINDINGS: Lower chest: No acute abnormality.

Hepatobiliary: Liver is normal in size and contour with no
suspicious mass identified. Gallbladder is surgically absent. No
biliary ductal dilatation identified. Note is made of a focal
calcification posterior to the liver which could possibly represent
a dropped gallstone.

Pancreas: Unremarkable. No pancreatic ductal dilatation or
surrounding inflammatory changes.

Spleen: Normal in size without focal abnormality.

Adrenals/Urinary Tract: Adrenal glands appear normal. Kidneys are
normal. Urinary bladder appears normal.

Stomach/Bowel: No bowel obstruction, free air or pneumatosis. Small
to moderate hiatal hernia. No bowel wall edema visualized. Appendix
is normal.

Vascular/Lymphatic: No significant vascular findings are present. No
enlarged abdominal or pelvic lymph nodes.

Reproductive: Uterus appears unremarkable. Right adnexal/ovarian
cyst measuring 5 x 3.6 cm.

Other: No ascites.

Musculoskeletal: No suspicious bony lesions identified.
IMPRESSION: 1. No acute abnormality identified in the abdomen or pelvis.
2. Small to moderate hiatal hernia.
3. Small umbilical hernia containing fat.
4. 5 cm right adnexal/ovarian cyst, no routine follow-up required.
Correlate clinically.

## 2023-07-21 ENCOUNTER — Ambulatory Visit: Admitting: Dermatology

## 2023-09-18 DIAGNOSIS — R87810 Cervical high risk human papillomavirus (HPV) DNA test positive: Secondary | ICD-10-CM | POA: Insufficient documentation

## 2023-10-31 DIAGNOSIS — R911 Solitary pulmonary nodule: Secondary | ICD-10-CM | POA: Insufficient documentation

## 2023-12-12 ENCOUNTER — Ambulatory Visit: Admitting: Dermatology

## 2023-12-20 ENCOUNTER — Encounter: Payer: Self-pay | Admitting: Dermatology

## 2023-12-20 ENCOUNTER — Ambulatory Visit: Admitting: Dermatology

## 2023-12-20 VITALS — BP 108/71 | HR 113

## 2023-12-20 DIAGNOSIS — R21 Rash and other nonspecific skin eruption: Secondary | ICD-10-CM

## 2023-12-20 DIAGNOSIS — L308 Other specified dermatitis: Secondary | ICD-10-CM

## 2023-12-20 DIAGNOSIS — L299 Pruritus, unspecified: Secondary | ICD-10-CM

## 2023-12-20 DIAGNOSIS — R110 Nausea: Secondary | ICD-10-CM | POA: Insufficient documentation

## 2023-12-20 DIAGNOSIS — L732 Hidradenitis suppurativa: Secondary | ICD-10-CM | POA: Insufficient documentation

## 2023-12-20 DIAGNOSIS — Z30017 Encounter for initial prescription of implantable subdermal contraceptive: Secondary | ICD-10-CM | POA: Insufficient documentation

## 2023-12-20 DIAGNOSIS — K219 Gastro-esophageal reflux disease without esophagitis: Secondary | ICD-10-CM | POA: Insufficient documentation

## 2023-12-20 MED ORDER — CLOBETASOL PROPIONATE 0.05 % EX CREA: 1.0000 | TOPICAL_CREAM | Freq: Two times a day (BID) | CUTANEOUS | 5 refills | Status: AC

## 2023-12-20 NOTE — Progress Notes (Signed)
   New Patient Visit   Subjective  Crystal Chase is a 33 y.o. female who presents for the following: Rash  Patient states she has rash located at the left upper legs that she would like to have examined. Patient reports the areas have been there for 1 year. She reports the areas are bothersome. Patient reports the area is itchy. Patient rates irritation 9 out of 10. Patient reports she has previously been treated for these areas. Patient was prescribed TMC Ointment 0.1% 3 times daily. Her last application of TMC was yesterday. She has been consistently applying the [facility]. She will take a break varing from a few days to a few weeks depending on the flare. Patient reports she does have hx of Eczema.   The following portions of the chart were reviewed this encounter and updated as appropriate: medications, allergies, medical history  Review of Systems:  No other skin or systemic complaints except as noted in HPI or Assessment and Plan.  Objective  Well appearing patient in no apparent distress; mood and affect are within normal limits.  A focused examination was performed of the following areas: LU Thigh  Relevant exam findings are noted in the Assessment and Plan.         Left Thigh - Anterior Large hyperpigment pruritic pink papule  Assessment & Plan   1. Recurrent Rash and Pruritus - Assessment:  Patient presents with a recurrent itchy rash on her leg, ongoing for approximately one year. First appeared shortly after receiving methotrexate injection for ectopic pregnancy treatment. Characterized by hyperpigmented patches with pruritic pink papules coalescing into plaques at both ends. Rash fluctuates in severity, with periods of clearance lasting up to a week, followed by recurrence. Itch severity ranges from 3/10 to 9/10. Differential diagnoses include papular eczema, hypersensitivity reaction, or possibly a granulomatous condition. Previous treatments included triamcinolone  cream  from PCP, which provided some relief. No clear correlation with menstrual cycle, diet, or clothing identified. Allergy  testing was previously recommended but results are not available.  - Plan:    Perform punch biopsy of affected area to determine etiology    Prescribe clobetasol  ointment for topical application to affected areas    Follow-up in 2 weeks for suture removal and biopsy results review    Advised patient about the biopsy procedure, including risks and benefits RASH AND OTHER NONSPECIFIC SKIN ERUPTION Left Thigh - Anterior Skin / nail biopsy - Left Thigh - Anterior Type of biopsy: punch   Informed consent: discussed and consent obtained   Timeout: patient name, date of birth, surgical site, and procedure verified   Procedure prep:  Patient was prepped and draped in usual sterile fashion Prep type:  Isopropyl alcohol Anesthesia: the lesion was anesthetized in a standard fashion   Anesthetic:  1% lidocaine w/ epinephrine 1-100,000 buffered w/ 8.4% NaHCO3 Punch size:  4 mm Suture size:  4-0 Suture type: nylon   Suture removal (days):  14 Hemostasis achieved with: suture and aluminum chloride   Outcome: patient tolerated procedure well   Post-procedure details: sterile dressing applied and wound care instructions given   Dressing type: petrolatum gauze    Return in about 2 weeks (around 01/03/2024) for Bx Results and Suture Removal F/U.  I, Jetta Ager, am acting as Neurosurgeon for [employer], DO.  Documentation: I have reviewed the above documentation for accuracy and completeness, and I agree with the above.  Louana Roup, DO

## 2023-12-20 NOTE — Patient Instructions (Addendum)
 Date: Dec 20, 2023  Hello Almyra,  Thank you for visiting today. Here is a summary of the key instructions:  - Medications: Apply clobetasol ointment to the affected areas and around them  - Procedures: A small punch biopsy was performed today  - Follow-up:   - Return in 2 weeks to have sutures removed   - We will discuss biopsy results at the follow-up appointment  Please reach out if you have any questions or concerns.  Warm regards,  Dr. Louana Roup, Dermatology    Patient Handout: Wound Care for Skin Biopsy Site  Taking Care of Your Skin Biopsy Site  Proper care of the biopsy site is essential for promoting healing and minimizing scarring. This handout provides instructions on how to care for your biopsy site to ensure optimal recovery.  1. Cleaning the Wound:  Clean the biopsy site daily with gentle soap and water. Gently pat the area dry with a clean, soft towel. Avoid harsh scrubbing or rubbing the area, as this can irritate the skin and delay healing.  2. Applying Aquaphor and Bandage:  After cleaning the wound, apply a thin layer of Aquaphor ointment to the biopsy site. Cover the area with a sterile bandage to protect it from dirt, bacteria, and friction. Change the bandage daily or as needed if it becomes soiled or wet.  3. Continued Care for One Week:  Repeat the cleaning, Aquaphor application, and bandaging process daily for one week following the biopsy procedure. Keeping the wound clean and moist during this initial healing period will help prevent infection and promote optimal healing.  4. Massaging Aquaphor into the Area:  ---After one week, discontinue the use of bandages but continue to apply Aquaphor to the biopsy site. ----Gently massage the Aquaphor into the area using circular motions. ---Massaging the skin helps to promote circulation and prevent the formation of scar tissue.   Additional Tips:  Avoid exposing the biopsy site to  direct sunlight during the healing process, as this can cause hyperpigmentation or worsen scarring. If you experience any signs of infection, such as increased redness, swelling, warmth, or drainage from the wound, contact your healthcare provider immediately. Follow any additional instructions provided by your healthcare provider for caring for the biopsy site and managing any discomfort. Conclusion:  Taking proper care of your skin biopsy site is crucial for ensuring optimal healing and minimizing scarring. By following these instructions for cleaning, applying Aquaphor, and massaging the area, you can promote a smooth and successful recovery. If you have any questions or concerns about caring for your biopsy site, don't hesitate to contact your healthcare provider for guidance.    Important Information   Due to recent changes in healthcare laws, you may see results of your pathology and/or laboratory studies on MyChart before the doctors have had a chance to review them. We understand that in some cases there may be results that are confusing or concerning to you. Please understand that not all results are received at the same time and often the doctors may need to interpret multiple results in order to provide you with the best plan of care or course of treatment. Therefore, we ask that you please give us  2 business days to thoroughly review all your results before contacting the office for clarification. Should we see a critical lab result, you will be contacted sooner.     If You Need Anything After Your Visit   If you have any questions or concerns for your doctor,  please call our main line at [phone]. If no one answers, please leave a voicemail as directed and we will return your call as soon as possible. Messages left after 4 pm will be answered the following business day.    You may also send us  a message via MyChart. We typically respond to MyChart messages within 1-2 business  days.  For prescription refills, please ask your pharmacy to contact our office. Our fax number is [phone].  If you have an urgent issue when the clinic is closed that cannot wait until the next business day, you can page your doctor at the number below.     Please note that while we do our best to be available for urgent issues outside of office hours, we are not available 24/7.    If you have an urgent issue and are unable to reach us , you may choose to seek medical care at your doctor's office, retail clinic, urgent care center, or emergency room.   If you have a medical emergency, please immediately call 911 or go to the emergency department. In the event of inclement weather, please call our main line at [phone] for an update on the status of any delays or closures.  Dermatology Medication Tips: Please keep the boxes that topical medications come in in order to help keep track of the instructions about where and how to use these. Pharmacies typically print the medication instructions only on the boxes and not directly on the medication tubes.   If your medication is too expensive, please contact our office at [phone] or send us  a message through MyChart.    We are unable to tell what your co-pay for medications will be in advance as this is different depending on your insurance coverage. However, we may be able to find a substitute medication at lower cost or fill out paperwork to get insurance to cover a needed medication.    If a prior authorization is required to get your medication covered by your insurance company, please allow us  1-2 business days to complete this process.   Drug prices often vary depending on where the prescription is filled and some pharmacies may offer cheaper prices.   The website www.goodrx.com contains coupons for medications through different pharmacies. The prices here do not account for what the cost may be with help from insurance (it may  be cheaper with your insurance), but the website can give you the price if you did not use any insurance.  - You can print the associated coupon and take it with your prescription to the pharmacy.  - You may also stop by our office during regular business hours and pick up a GoodRx coupon card.  - If you need your prescription sent electronically to a different pharmacy, notify our office through [facility] or by phone at [phone]

## 2023-12-27 LAB — SURGICAL PATHOLOGY

## 2023-12-30 ENCOUNTER — Ambulatory Visit: Payer: Self-pay | Admitting: Dermatology

## 2024-01-04 ENCOUNTER — Ambulatory Visit (INDEPENDENT_AMBULATORY_CARE_PROVIDER_SITE_OTHER): Admitting: Dermatology

## 2024-01-04 ENCOUNTER — Encounter: Payer: Self-pay | Admitting: Dermatology

## 2024-01-04 DIAGNOSIS — L299 Pruritus, unspecified: Secondary | ICD-10-CM

## 2024-01-04 DIAGNOSIS — L300 Nummular dermatitis: Secondary | ICD-10-CM | POA: Diagnosis not present

## 2024-01-04 NOTE — Patient Instructions (Signed)

## 2024-01-04 NOTE — Progress Notes (Unsigned)
   Follow-Up Visit   Subjective  Crystal Chase is a 33 y.o. female who presents for the following: Nummular Eczema  Patient present today for follow up visit for Nummular Eczema. Patient was last evaluated on 12/20/23. At this visit patient was prescribed Clobetasol  Cream. Patient reports sxs are unchanged. She states the clobetasol  worked for the upper thigh but she still has new areas apperaing. She states the area is still very itchy despite applying the clobetasol  2 times daily. She states her itch can range fro 5-10 out of 10. Patient denies medication changes.  The following portions of the chart were reviewed this encounter and updated as appropriate: medications, allergies, medical history  Review of Systems:  No other skin or systemic complaints except as noted in HPI or Assessment and Plan.  Objective  Well appearing patient in no apparent distress; mood and affect are within normal limits.  A focused examination was performed of the following areas: Left Thigh   Relevant exam findings are noted in the Assessment and Plan.           Assessment & Plan   ATOPIC DERMATITIS Exam: Scaly pink papules coalescing to plaques,  ***% BSA IGA: ***  Flared  Atopic dermatitis (eczema) is a chronic, relapsing, pruritic condition that can significantly affect quality of life. It is often associated with allergic rhinitis and/or asthma and can require treatment with topical medications, phototherapy, or in severe cases biologic injectable medication (Dupixent; Adbry) or Oral JAK inhibitors.  Treatment Plan: - Recommend gentle skin care - Advised to continue the Clobetasol  Cream 2 BID for up to 2 weeks - Advised to apply Cerave Anti Itch for the 2 weeks you will take a break  - Plan to follow up in 6 months    Return in about 6 months (around 07/05/2024) for Eczema F/U.  I, Jetta Ager, am acting as Neurosurgeon for [employer], DO.  Documentation: I have reviewed the above  documentation for accuracy and completeness, and I agree with the above.  Louana Roup, DO
